# Patient Record
Sex: Female | Born: 2000
Health system: Southern US, Community
[De-identification: ages and names within clinical notes are randomized; demographics above are authoritative.]

## PROBLEM LIST (undated history)

## (undated) DIAGNOSIS — L509 Urticaria, unspecified: Secondary | ICD-10-CM

## (undated) DIAGNOSIS — R1013 Epigastric pain: Secondary | ICD-10-CM

## (undated) DIAGNOSIS — G43909 Migraine, unspecified, not intractable, without status migrainosus: Secondary | ICD-10-CM

## (undated) DIAGNOSIS — K219 Gastro-esophageal reflux disease without esophagitis: Secondary | ICD-10-CM

## (undated) DIAGNOSIS — J189 Pneumonia, unspecified organism: Secondary | ICD-10-CM

## (undated) DIAGNOSIS — Z91018 Allergy to other foods: Secondary | ICD-10-CM

## (undated) DIAGNOSIS — R0982 Postnasal drip: Secondary | ICD-10-CM

## (undated) DIAGNOSIS — J45909 Unspecified asthma, uncomplicated: Secondary | ICD-10-CM

## (undated) HISTORY — DX: Epigastric pain: R10.13

## (undated) HISTORY — PX: TONGUE SURGERY: SHX810

## (undated) HISTORY — DX: Urticaria, unspecified: L50.9

## (undated) HISTORY — DX: Allergy to other foods: Z91.018

---
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
## 2019-01-05 ENCOUNTER — Other Ambulatory Visit: Payer: Self-pay | Admitting: Otolaryngology

## 2019-01-05 ENCOUNTER — Other Ambulatory Visit (HOSPITAL_COMMUNITY): Payer: Self-pay | Admitting: Otolaryngology

## 2019-01-05 DIAGNOSIS — J320 Chronic maxillary sinusitis: Secondary | ICD-10-CM

## 2019-01-10 ENCOUNTER — Other Ambulatory Visit: Payer: Self-pay

## 2019-01-10 ENCOUNTER — Encounter (HOSPITAL_COMMUNITY): Payer: Self-pay | Admitting: Emergency Medicine

## 2019-01-10 ENCOUNTER — Emergency Department (HOSPITAL_COMMUNITY)
Admission: EM | Admit: 2019-01-10 | Discharge: 2019-01-11 | Disposition: A | Discharge status: Home / Self Care | Payer: BLUE CROSS/BLUE SHIELD | Attending: Emergency Medicine | Admitting: Emergency Medicine

## 2019-01-10 DIAGNOSIS — Z79899 Other long term (current) drug therapy: Secondary | ICD-10-CM | POA: Diagnosis not present

## 2019-01-10 DIAGNOSIS — Z91010 Allergy to peanuts: Secondary | ICD-10-CM | POA: Insufficient documentation

## 2019-01-10 DIAGNOSIS — R682 Dry mouth, unspecified: Secondary | ICD-10-CM | POA: Insufficient documentation

## 2019-01-10 DIAGNOSIS — Z91018 Allergy to other foods: Secondary | ICD-10-CM | POA: Insufficient documentation

## 2019-01-10 DIAGNOSIS — J45909 Unspecified asthma, uncomplicated: Secondary | ICD-10-CM | POA: Insufficient documentation

## 2019-01-10 DIAGNOSIS — G43109 Migraine with aura, not intractable, without status migrainosus: Secondary | ICD-10-CM | POA: Insufficient documentation

## 2019-01-10 DIAGNOSIS — R51 Headache: Secondary | ICD-10-CM | POA: Diagnosis present

## 2019-01-10 DIAGNOSIS — E739 Lactose intolerance, unspecified: Secondary | ICD-10-CM | POA: Diagnosis not present

## 2019-01-10 LAB — CBC WITH DIFFERENTIAL/PLATELET
Abs Immature Granulocytes: 0.01 10*3/uL (ref 0.00–0.07)
Basophils Absolute: 0 10*3/uL (ref 0.0–0.1)
Basophils Relative: 0 %
Eosinophils Absolute: 0.4 10*3/uL (ref 0.0–0.5)
Eosinophils Relative: 6 %
HCT: 38.4 % (ref 36.0–46.0)
Hemoglobin: 12.8 g/dL (ref 12.0–15.0)
Immature Granulocytes: 0 %
Lymphocytes Relative: 38 %
Lymphs Abs: 2.4 10*3/uL (ref 0.7–4.0)
MCH: 30.7 pg (ref 26.0–34.0)
MCHC: 33.3 g/dL (ref 30.0–36.0)
MCV: 92.1 fL (ref 80.0–100.0)
Monocytes Absolute: 0.5 10*3/uL (ref 0.1–1.0)
Monocytes Relative: 8 %
Neutro Abs: 3 10*3/uL (ref 1.7–7.7)
Neutrophils Relative %: 48 %
Platelets: 226 10*3/uL (ref 150–400)
RBC: 4.17 MIL/uL (ref 3.87–5.11)
RDW: 12.2 % (ref 11.5–15.5)
WBC: 6.3 10*3/uL (ref 4.0–10.5)
nRBC: 0 % (ref 0.0–0.2)

## 2019-01-10 LAB — BASIC METABOLIC PANEL
Anion gap: 11 (ref 5–15)
BUN: 16 mg/dL (ref 6–20)
CO2: 23 mmol/L (ref 22–32)
Calcium: 9 mg/dL (ref 8.9–10.3)
Chloride: 104 mmol/L (ref 98–111)
Creatinine, Ser: 0.65 mg/dL (ref 0.44–1.00)
GFR calc Af Amer: >60 mL/min (ref >60)
GFR calc non Af Amer: >60 mL/min (ref >60)
Glucose, Bld: 110 mg/dL — ABNORMAL HIGH (ref 70–99)
Potassium: 3.7 mmol/L (ref 3.5–5.1)
Sodium: 138 mmol/L (ref 135–145)

## 2019-01-10 LAB — POC URINE PREG, ED: Preg Test, Ur: NEGATIVE

## 2019-01-10 MED ORDER — BUTALBITAL-APAP-CAFFEINE 50-325-40 MG PO TABS
1.0000 | ORAL_TABLET | Freq: Once | ORAL | Status: AC
  Administered 2019-01-10: 1 via ORAL
  Filled 2019-01-10: qty 1

## 2019-01-10 NOTE — ED Triage Notes (Signed)
Pt c/o headache and stiff neck x one hour. Pt states her mouth is dry.

## 2019-01-10 NOTE — ED Notes (Signed)
Called no answer

## 2019-01-11 NOTE — ED Provider Notes (Signed)
[facility]ERGENCY DEPARTMENT Provider Note   CSN: [id number] Arrival date & time: 01/10/19  1921     History   Chief Complaint Chief Complaint  Patient presents with  . Headache    HPI Pamela Wells is a 18 y.o. female with a history as outlined below including migraine headache diagnosed by her neurologist in [place] in [place], presenting with onset of headache consistent with prior migraines with the exception of new symptom of dry mouth.  She has had MRI imaging as part of her work up. Her headache is left frontal and is associated with numbness without weakness of the left side of her neck radiating into her left arm which is her typical pattern. She denies prodromal sx, no photophobia.  She is prescribed  fioricet for control of acute symptoms but was unable to get her refill from the pharmacy today.   There has been no fevers, chills, syncope, confusion or localized weakness.       The history is provided by the patient.    Past Medical History:  Diagnosis Date  . Asthma    no inhaler use in the past 2 months. 09-08-17  . Food allergy   . GERD (gastroesophageal reflux disease)   . Migraines   . Pneumonia 2018  . Post-nasal drip    pt states "sinus disease"   . Urticaria     Patient Active Problem List   Diagnosis Date Noted  . Moderate persistent asthma without complication 12/22/2017  . Seasonal and perennial allergic rhinitis 12/22/2017  . Dyspepsia   . Abdominal pain, RUQ (right upper quadrant) 09/03/2017  . Dysphagia 09/03/2017  . Multiple food allergies 07/17/2017  . 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 Constipation 07/17/2017    Past Surgical History:  Procedure Laterality Date  . ESOPHAGOGASTRODUODENOSCOPY N/A 09/25/2017   Procedure: ESOPHAGOGASTRODUODENOSCOPY (EGD);  Surgeon: West BaliFields, Sandi L, MD;  Location: [facility];  Service: Endoscopy;  Laterality: N/A;  9:30am  . TONGUE SURGERY     HAD FRENULUM CLIPPED     OB History    Gravida      Para      Term      Preterm      AB      Living  0     SAB      TAB      Ectopic      Multiple      Live Births           Obstetric Comments  Menses monthly. Occur each month. Menarche at age 18.          Home Medications    Prior to Admission medications   Medication Sig Start Date End Date Taking? Authorizing Provider  albuterol (PROVENTIL HFA;VENTOLIN HFA) 108 (90 Base) MCG/ACT inhaler Inhale 1-2 puffs into the lungs every 6 (six) hours as needed for wheezing or shortness of breath. 12/22/17  Yes Alfonse SpruceGallagher, Joel Louis, MD  albuterol (PROVENTIL) (2.5 MG/3ML) 0.083% nebulizer solution Take 3 mLs (2.5 mg total) by nebulization every 4 (four) hours as needed for wheezing or shortness of breath. 10/20/18  Yes Alfonse SpruceGallagher, Joel Louis, MD  butalbital-acetaminophen-caffeine (FIORICET) [phone] MG tablet Take 1 tablet by mouth every 6 (six) hours as needed for headache.  09/22/18  Yes [provider]  fluticasone furoate-vilanterol (BREO ELLIPTA) 100-25 MCG/INH AEPB Inhale 1 puff into the lungs daily. 10/21/18  Yes Alfonse SpruceGallagher, Joel Louis, MD  imipramine (TOFRANIL) 10 MG tablet Take 10 mg by  mouth at bedtime.  08/18/18  Yes [provider]  montelukast (SINGULAIR) 10 MG tablet Take 1 tablet (10 mg total) by mouth at bedtime. 10/21/18  Yes Valentina Shaggy, MD  azithromycin (ZITHROMAX) 250 MG tablet TAKE 2 TABLETS BY MOUTH ON DAY 1 AND THEN TAKE 1 TABLET BY MOUTH ONCE A DAY ON DAY 2 THROUGH DAY 5 11/22/18   [provider]  cetirizine (ZYRTEC) 10 MG tablet Take 1 tablet (10 mg total) by mouth daily for 30 days. 10/21/18 11/20/18  Valentina Shaggy, MD  Fluticasone Propionate Truett Perna) 93 MCG/ACT EXHU Place 2 sprays into the nose 2 (two) times a day. Patient not taking: Reported on 01/10/2019 11/24/18   Valentina Shaggy, MD  predniSONE (DELTASONE) 10 MG tablet Take two tablets (20mg ) twice daily for three days, then one tablet (10mg ) twice  daily for three days, then STOP. Patient not taking: Reported on 01/10/2019 12/01/18   Valentina Shaggy, MD  SUMAtriptan (IMITREX) 25 MG tablet Take 25 mg by mouth every 2 (two) hours as needed for headache.  08/17/18   [provider]    Family History Family History  Problem Relation Age of Onset  . Hypertension Father   . Ovarian cancer Maternal Grandmother   . Colon cancer Neg Hx   . Colon polyps Neg Hx     Social History Social History   Tobacco Use  . Smoking status: Never Smoker  . Smokeless tobacco: Never Used  Substance Use Topics  . Alcohol use: Not Currently    Comment: occ  . Drug use: No     Allergies   Cheese, Lactose intolerance (gi), Peanut-containing drug products, and Sesame oil   Review of Systems Review of Systems  Constitutional: Negative for fever.  HENT: Negative for congestion and sore throat.   Eyes: Negative.   Respiratory: Negative for chest tightness and shortness of breath.   Cardiovascular: Negative for chest pain.  Gastrointestinal: Negative for abdominal pain and nausea.  Genitourinary: Negative.   Musculoskeletal: Negative for arthralgias, joint swelling and neck pain.  Skin: Negative.  Negative for rash and wound.  Neurological: Positive for numbness and headaches. Negative for dizziness, weakness and light-headedness.  Psychiatric/Behavioral: Negative.      Physical Exam Updated Vital Signs BP 115/84 (BP Location: Right Arm)   Pulse 72   Temp 98 F (36.7 C) (Oral)   Resp 17   Ht 5' 3.5" (1.613 m)   Wt 71.2 kg   LMP 12/13/2018   SpO2 100%   BMI 27.38 kg/m   Physical Exam Vitals signs and nursing note reviewed.  Constitutional:      Appearance: She is well-developed.     Comments: Uncomfortable appearing  HENT:     Head: Normocephalic and atraumatic.  Eyes:     Pupils: Pupils are equal, round, and reactive to light.  Neck:     Musculoskeletal: Normal range of motion and neck supple.  Cardiovascular:      Rate and Rhythm: Normal rate.     Heart sounds: Normal heart sounds.  Pulmonary:     Effort: Pulmonary effort is normal.  Abdominal:     Palpations: Abdomen is soft.     Tenderness: There is no abdominal tenderness.  Musculoskeletal: Normal range of motion.  Lymphadenopathy:     Cervical: No cervical adenopathy.  Skin:    General: Skin is warm and dry.     Findings: No rash.  Neurological:     Mental Status: She is  alert and oriented to person, place, and time.     GCS: GCS eye subscore is 4. GCS verbal subscore is 5. GCS motor subscore is 6.     Sensory: No sensory deficit.     Gait: Gait normal.     Comments: Normal heel-shin, normal finger nose. Cranial nerves III-XII intact.  No pronator drift. Equal grips.  Decreased sensation to light touch, sharp intact left arm, stocking glove distribution.  Psychiatric:        Speech: Speech normal.        Behavior: Behavior normal.        Thought Content: Thought content normal.      ED Treatments / Results  Labs (all labs ordered are listed, but only abnormal results are displayed) Labs Reviewed  BASIC METABOLIC PANEL - Abnormal; Notable for the following components:      Result Value   Glucose, Bld 110 (*)    All other components within normal limits  CBC WITH DIFFERENTIAL/PLATELET  POC URINE PREG, ED    EKG None  Radiology No results found.  Procedures Procedures (including critical care time)  Medications Ordered in ED Medications  butalbital-acetaminophen-caffeine (FIORICET) 50-325-40 MG per tablet 1 tablet (1 tablet Oral Given 01/10/19 2317)     Initial Impression / Assessment and Plan / ED Course  I have reviewed the triage vital signs and the nursing notes.  Pertinent labs & imaging results that were available during my care of the patient were reviewed by me and considered in my medical decision making (see chart for details).        Pt given fioricet here with resolution of headache, improved numbness,  still with dry mouth.  Labs reviewed, no acute findings.  Advised increased fluids, get fioricet filled for sx relief prn. Prn f/u anticipated.  Final Clinical Impressions(s) / ED Diagnoses   Final diagnoses:  Migraine with aura and without status migrainosus, not intractable    ED Discharge Orders    None       Victoriano Laindol, Jeslynn Hollander, PA-C 01/12/19 1238    Loren RacerYelverton, David, MD 01/13/19 1505

## 2019-01-11 NOTE — ED Notes (Signed)
Discharge signature was signed on incorrect pt

## 2019-01-11 NOTE — Discharge Instructions (Signed)
Plan to get your refill medications for your migraine headaches filled tomorrow as discussed and take per your neurologists instructions.  Return here for any return or worsening of your symptoms.

## 2019-01-14 ENCOUNTER — Ambulatory Visit (HOSPITAL_COMMUNITY)
Admission: RE | Admit: 2019-01-14 | Discharge: 2019-01-14 | Disposition: A | Discharge status: Home / Self Care | Payer: BLUE CROSS/BLUE SHIELD | Source: Ambulatory Visit | Attending: Otolaryngology | Admitting: Otolaryngology

## 2019-01-14 ENCOUNTER — Other Ambulatory Visit: Payer: Self-pay

## 2019-01-14 DIAGNOSIS — J320 Chronic maxillary sinusitis: Secondary | ICD-10-CM

## 2019-01-27 ENCOUNTER — Ambulatory Visit (INDEPENDENT_AMBULATORY_CARE_PROVIDER_SITE_OTHER): Payer: BLUE CROSS/BLUE SHIELD | Admitting: Otolaryngology

## 2019-02-03 ENCOUNTER — Other Ambulatory Visit: Payer: Self-pay

## 2019-02-03 ENCOUNTER — Encounter: Payer: Self-pay | Admitting: Neurology

## 2019-02-03 ENCOUNTER — Ambulatory Visit (INDEPENDENT_AMBULATORY_CARE_PROVIDER_SITE_OTHER): Payer: BLUE CROSS/BLUE SHIELD | Admitting: Neurology

## 2019-02-03 VITALS — BP 106/61 | HR 72 | Temp 97.8°F | Ht 63.5 in | Wt 165.4 lb

## 2019-02-03 DIAGNOSIS — R7309 Other abnormal glucose: Secondary | ICD-10-CM

## 2019-02-03 DIAGNOSIS — G43109 Migraine with aura, not intractable, without status migrainosus: Secondary | ICD-10-CM

## 2019-02-03 DIAGNOSIS — G43719 Chronic migraine without aura, intractable, without status migrainosus: Secondary | ICD-10-CM

## 2019-02-03 DIAGNOSIS — R682 Dry mouth, unspecified: Secondary | ICD-10-CM

## 2019-02-03 HISTORY — DX: Chronic migraine without aura, intractable, without status migrainosus: G43.719

## 2019-02-03 HISTORY — DX: Migraine with aura, not intractable, without status migrainosus: G43.109

## 2019-02-03 MED ORDER — ONDANSETRON 4 MG PO TBDP: 4.0000 mg | ORAL_TABLET | Freq: Three times a day (TID) | ORAL | 3 refills | Status: DC | PRN

## 2019-02-03 MED ORDER — TOPIRAMATE 100 MG PO TABS: 100.0000 mg | ORAL_TABLET | Freq: Two times a day (BID) | ORAL | 6 refills | Status: DC

## 2019-02-03 MED ORDER — RIZATRIPTAN BENZOATE 10 MG PO TBDP: 10.0000 mg | ORAL_TABLET | ORAL | 11 refills | Status: DC | PRN

## 2019-02-03 MED ORDER — AMITRIPTYLINE HCL 25 MG PO TABS: 25.0000 mg | ORAL_TABLET | Freq: Every day | ORAL | 6 refills | Status: DC

## 2019-02-03 NOTE — Progress Notes (Signed)
[facility]    Provider:  Dr Lucia GaskinsAhern Requesting Provider: No ref. provider found Primary Care Provider:  Default, Provider, MD  CC:  migraines  HPI:  Pamela Wells is a 18 y.o. female here as requested by the emergency room for migraines. She has a PMHx of migraines and had a workup including MRI of the brain without etiology. She tried Melatonin 1000mg , she is not sleeping. She tried Fioricet, Topamax, she stopped the Topamax because she had to be on antibiotics and felt the combo made her feel bad, she restarted Topamax and still on it, likely 25mg  once daily (in prior notes was given Topamax 25mg  twice daily with increase to 50mg  bid but she never did that and only on 25mg  once daily now. She went to the ED recently due to severe migraines, she does get left arm and leg numbness and tingling but her whole left side was painful, she couldn't move her neck. She did NOT take her medications when the migraine started and then went to ED. Imitrex did not work. SHe has migraines with nausea, migraine on the left side, throbbing in the back and in the neck, light and sound sensitivity, ongoing for a year. No family history. She can have dry heaves and nausea. They can be moderately severe to severe. She does not have a pcp, she needs one asked her. She has an aura, dots, precedes the migraine and also get sensory changes in the left side, she has dizziness. No OTC medication.   Reviewed notes, labs and imaging from outside physicians, which showed:  tsh normal  Cb/cmp with elevated gkucose  Reviewed report 08/2018 MRI brain w/wo cont: MRI brain normal. Sinus disease.   Review of Systems: Patient complains of symptoms per HPI as well as the following symptoms: sinus disease. Pertinent negatives and positives per HPI. All others negative.   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[employer], [place]  Social Needs   Financial resource strain: Not on file   Food insecurity    Worry: Not on file    Inability: Not on file   Transportation needs    Medical: Not on file    Non-medical: Not on file  Tobacco Use   Smoking status: Never Smoker   Smokeless tobacco: Never Used  Substance and Sexual Activity   Alcohol use: Not Currently    Comment: occ   Drug use: No   Sexual activity: Never  Lifestyle   Physical activity    Days per week: Not on file    Minutes per session: Not on file   Stress: Not on file  Relationships   Social connections    Talks on phone: Not on file    Gets together: Not on file    Attends religious service: Not on file    Active member of club or organization: Not on file    Attends meetings of clubs or organizations: Not on file    Relationship status: Not on file   Intimate partner violence    Fear of current or ex partner: Not on file    Emotionally abused: Not on file    Physically abused: Not on file    Forced sexual activity: Not on file  Other Topics Concern   Not on file  Social History Narrative   [place], [place]    Eats all food groups.   Wears seatbelt.    Drives a car.    Does not smoke.    Reports that is religious and believes in God.   Reports she is not sexually active.  Denies any tobacco, alcohol, or drug use.    Family History  Problem Relation Age of Onset   Hypertension Father    Diabetes Father    Ovarian cancer Maternal Grandmother    Stroke Maternal Grandfather    Colon cancer Neg Hx    Colon polyps Neg Hx     Past Medical History:  Diagnosis Date   Asthma    no inhaler use in the past 2 months. 09-08-17   Food allergy    GERD (gastroesophageal reflux disease)    Migraines    migraines   Pneumonia 2018   Post-nasal drip    pt states "sinus disease"      Urticaria     Patient Active Problem List   Diagnosis Date Noted   Moderate persistent asthma without complication 12/22/2017   Seasonal and perennial allergic rhinitis 12/22/2017   Dyspepsia    Abdominal pain, RUQ (right upper quadrant) 09/03/2017   Dysphagia 09/03/2017   Multiple food allergies 07/17/2017   Skin pigmentation disorder 07/17/2017   Constipation 07/17/2017    Past Surgical History:  Procedure Laterality Date   ESOPHAGOGASTRODUODENOSCOPY N/A 09/25/2017   Procedure: ESOPHAGOGASTRODUODENOSCOPY (EGD);  Surgeon: West BaliFields, Sandi L, MD;  Location: [facility];  Service: Endoscopy;  Laterality: N/A;  9:30am   TONGUE SURGERY     HAD FRENULUM CLIPPED    Current Outpatient Medications  Medication Sig Dispense Refill   albuterol (PROVENTIL HFA;VENTOLIN HFA) 108 (90 Base) MCG/ACT inhaler Inhale 1-2 puffs into the lungs every 6 (six) hours as needed for wheezing or shortness of breath. 1 Inhaler 1   albuterol (PROVENTIL) (2.5 MG/3ML) 0.083% nebulizer solution Take 3 mLs (2.5 mg total) by nebulization every 4 (four) hours as needed for wheezing or shortness of breath. 75 mL 1   cetirizine (ZYRTEC) 10 MG tablet Take 1 tablet (10 mg total) by mouth daily for 30 days. 30 tablet 5   fluticasone furoate-vilanterol (BREO ELLIPTA) 100-25 MCG/INH AEPB Inhale 1 puff into the lungs daily. 30 each 5   Fluticasone Propionate (XHANCE) 93 MCG/ACT EXHU Place 2 sprays into the nose 2 (two) times a day. 16 mL 5   montelukast (SINGULAIR) 10 MG tablet Take 1 tablet (10 mg total) by mouth at bedtime. 30 tablet 5   amitriptyline (ELAVIL) 25 MG tablet Take 1-2 tablets (25-50 mg total) by mouth at bedtime. 60 tablet 6   ondansetron (ZOFRAN-ODT) 4 MG disintegrating tablet Take 1 tablet (4 mg total) by mouth every 8 (eight) hours as needed for nausea. 30 tablet 3   rizatriptan (MAXALT-MLT) 10 MG disintegrating tablet Take 1 tablet (10 mg total) by mouth as needed for migraine. May  repeat in 2 hours if needed 9 tablet 11   topiramate (TOPAMAX) 100 MG tablet Take 1 tablet (100 mg total) by mouth 2 (two) times daily. 90 tablet 6   No current facility-administered medications for this visit.     Allergies as of 02/03/2019 - Review Complete 02/03/2019  Allergen Reaction Noted   Cheese Hives 12/31/2012   Lactose intolerance (gi)     Peanut-containing drug products Hives 12/31/2012   Sesame oil  08/17/2018  Vitals: BP 106/61    Pulse 72    Temp 97.8 F (36.6 C)    Ht 5' 3.5" (1.613 m)    Wt 165 lb 6.4 oz (75 kg)    BMI 28.84 kg/m  Last Weight:  Wt Readings from Last 1 Encounters:  02/03/19 165 lb 6.4 oz (75 kg) (90 %, Z= 1.29)*   * Growth percentiles are based on CDC (Girls, 2-20 Years) data.   Last Height:   Ht Readings from Last 1 Encounters:  02/03/19 5' 3.5" (1.613 m) (38 %, Z= -0.31)*   * Growth percentiles are based on CDC (Girls, 2-20 Years) data.     Physical exam: Exam: Gen: NAD, conversant, well nourised, well groomed                     CV: RRR, no MRG. No Carotid Bruits. No peripheral edema, warm, nontender Eyes: Conjunctivae clear without exudates or hemorrhage  Neuro: Detailed Neurologic Exam  Speech:    Speech is normal; fluent and spontaneous with normal comprehension.  Cognition:    The patient is oriented to person, place, and time;     recent and remote memory intact;     language fluent;     normal attention, concentration,     fund of knowledge Cranial Nerves:    The pupils are equal, round, and reactive to light. The fundi are normal and spontaneous venous pulsations are present. Visual fields are full to finger confrontation. Extraocular movements are intact. Trigeminal sensation is intact and the muscles of mastication are normal. The face is symmetric. The palate elevates in the midline. Hearing intact. Voice is normal. Shoulder shrug is normal. The tongue has normal motion without fasciculations.   Coordination:     Normal finger to nose and heel to shin. Normal rapid alternating movements.   Gait:    Heel-toe and tandem gait are normal.   Motor Observation:    No asymmetry, no atrophy, and no involuntary movements noted. Tone:    Normal muscle tone.    Posture:    Posture is normal. normal erect    Strength:    Strength is V/V in the upper and lower limbs.      Sensation: intact to LT     Reflex Exam:  DTR's:    Deep tendon reflexes in the upper and lower extremities are normal bilaterally.   Toes:    The toes are downgoing bilaterally.   Clonus:    Clonus is absent.    Assessment/Plan:  Chronic migraines with and without aura intractable without status migrainosus  - She does not have a pcp, she needs one asked her - Sinus disease seen on last MRI, needs follow up can be contributing ti headaches - Discussed contraindication for triptans in basilar type migraie but there is plenty of evidence to show they are safe, she understands the risks and can try maxalt. - Sinus disease, following with ENT - Preventative Topamax (discussed teratogenicity with Topiramate, do not get pregnant) - Acute Maxalt and Zofran  There is increased risk for stroke in women with migraine with aura and a contraindication for the combined contraceptive pill for use by women who have migraine with aura. The risk for women with migraine without aura is lower. However other risk factors like smoking are far more likely to increase stroke risk than migraine. There is a recommendation for no smoking and for the use of OCPs without estrogen such as progestogen only pills  particularly for women with migraine with aura.Marland Kitchen People who have migraine headaches with auras may be 3 times more likely to have a stroke caused by a blood clot, compared to migraine patients who don't see auras. Women who take hormone-replacement therapy may be 30 percent more likely to suffer a clot-based stroke than women not taking medication  containing estrogen. Other risk factors like smoking and high blood pressure may be  much more important.    Orders Placed This Encounter  Procedures   Hemoglobin A1c   Meds ordered this encounter  Medications   topiramate (TOPAMAX) 100 MG tablet    Sig: Take 1 tablet (100 mg total) by mouth 2 (two) times daily.    Dispense:  90 tablet    Refill:  6   amitriptyline (ELAVIL) 25 MG tablet    Sig: Take 1-2 tablets (25-50 mg total) by mouth at bedtime.    Dispense:  60 tablet    Refill:  6   rizatriptan (MAXALT-MLT) 10 MG disintegrating tablet    Sig: Take 1 tablet (10 mg total) by mouth as needed for migraine. May repeat in 2 hours if needed    Dispense:  9 tablet    Refill:  11   ondansetron (ZOFRAN-ODT) 4 MG disintegrating tablet    Sig: Take 1 tablet (4 mg total) by mouth every 8 (eight) hours as needed for nausea.    Dispense:  30 tablet    Refill:  3   Discussed: To prevent or relieve headaches, try the following: Cool Compress. Lie down and place a cool compress on your head.  Avoid headache triggers. If certain foods or odors seem to have triggered your migraines in the past, avoid them. A headache diary might help you identify triggers.  Include physical activity in your daily routine. Try a daily walk or other moderate aerobic exercise.  Manage stress. Find healthy ways to cope with the stressors, such as delegating tasks on your to-do list.  Practice relaxation techniques. Try deep breathing, yoga, massage and visualization.  Eat regularly. Eating regularly scheduled meals and maintaining a healthy diet might help prevent headaches. Also, drink plenty of fluids.  Follow a regular sleep schedule. Sleep deprivation might contribute to headaches Consider biofeedback. With this mind-body technique, you learn to control certain bodily functions -- such as muscle tension, heart rate and blood pressure -- to prevent headaches or reduce headache pain.    Proceed to emergency  room if you experience new or worsening symptoms or symptoms do not resolve, if you have new neurologic symptoms or if headache is severe, or for any concerning symptom.   Provided education and documentation from American headache Society toolbox including articles on: chronic migraine medication overuse headache, chronic migraines, prevention of migraines, behavioral and other nonpharmacologic treatments for headache.  A total of 60 minutes was spent face-to-face with this patient. Over half this time was spent on counseling patient on the  1. Intractable chronic migraine without aura and without status migrainosus   2. Basilar migraine   3. Migraine with aura and without status migrainosus, not intractable   4. Elevated glucose level   5. Dry mouth    diagnosis and different diagnostic and therapeutic options, counseling and coordination of care, risks ans benefits of management, compliance, or risk factor reduction and education.     Sarina Ill, MD  [facility] [address] [place], [place] [address]  Phone [phone] Fax [phone]

## 2019-02-03 NOTE — Patient Instructions (Addendum)
Increase Topamax to 100mg  at bedtime Start Amitriptyline at bedtime Stop Fioricet - risk of rebound headache At onset of headache: Rizatriptan(maxalt) and ondansetron(zofran) for nausea can actually take them together. Please take one tablet at the onset of your headache. If it does not improve the symptoms please take one additional tablet. Do not take more then 2 tablets in 24hrs. Do not take use more then 2 to 3 times in a week. DO not get pregnat   There is increased risk for stroke in women with migraine with aura and a contraindication for the combined contraceptive pill for use by women who have migraine with aura. The risk for women with migraine without aura is lower. However other risk factors like smoking are far more likely to increase stroke risk than migraine. There is a recommendation for no smoking and for the use of OCPs without estrogen such as progestogen only pills particularly for women with migraine with aura.Marland Kitchen. People who have migraine headaches with auras may be 3 times more likely to have a stroke caused by a blood clot, compared to migraine patients who don't see auras. Women who take hormone-replacement therapy may be 30 percent more likely to suffer a clot-based stroke than women not taking medication containing estrogen. Other risk factors like smoking and high blood pressure may be  much more important.  Ondansetron tablets What is this medicine? ONDANSETRON (on DAN se tron) is used to treat nausea and vomiting caused by chemotherapy. It is also used to prevent or treat nausea and vomiting after surgery. This medicine may be used for other purposes; ask your health care provider or pharmacist if you have questions. COMMON BRAND NAME(S): Zofran What should I tell my health care provider before I take this medicine? They need to know if you have any of these conditions:  heart disease  history of irregular heartbeat  liver disease  low levels of magnesium or  potassium in the blood  an unusual or allergic reaction to ondansetron, granisetron, other medicines, foods, dyes, or preservatives  pregnant or trying to get pregnant  breast-feeding How should I use this medicine? Take this medicine by mouth with a glass of water. Follow the directions on your prescription label. Take your doses at regular intervals. Do not take your medicine more often than directed. Talk to your pediatrician regarding the use of this medicine in children. Special care may be needed. Overdosage: If you think you have taken too much of this medicine contact a poison control center or emergency room at once. NOTE: This medicine is only for you. Do not share this medicine with others. What if I miss a dose? If you miss a dose, take it as soon as you can. If it is almost time for your next dose, take only that dose. Do not take double or extra doses. What may interact with this medicine? Do not take this medicine with any of the following medications:  apomorphine  certain medicines for fungal infections like fluconazole, itraconazole, ketoconazole, posaconazole, voriconazole  cisapride  dronedarone  pimozide  thioridazine This medicine may also interact with the following medications:  carbamazepine  certain medicines for depression, anxiety, or psychotic disturbances  fentanyl  linezolid  MAOIs like Carbex, Eldepryl, Marplan, Nardil, and Parnate  methylene blue (injected into a vein)  other medicines that prolong the QT interval (cause an abnormal heart rhythm) like dofetilide, ziprasidone  phenytoin  rifampicin  tramadol This list may not describe all possible interactions. Give your health  care provider a list of all the medicines, herbs, non-prescription drugs, or dietary supplements you use. Also tell them if you smoke, drink alcohol, or use illegal drugs. Some items may interact with your medicine. What should I watch for while using this  medicine? Check with your doctor or health care professional right away if you have any sign of an allergic reaction. What side effects may I notice from receiving this medicine? Side effects that you should report to your doctor or health care professional as soon as possible:  allergic reactions like skin rash, itching or hives, swelling of the face, lips or tongue  breathing problems  confusion  dizziness  fast or irregular heartbeat  feeling faint or lightheaded, falls  fever and chills  loss of balance or coordination  seizures  sweating  swelling of the hands or feet  tightness in the chest  tremors  unusually weak or tired Side effects that usually do not require medical attention (report to your doctor or health care professional if they continue or are bothersome):  constipation or diarrhea  headache This list may not describe all possible side effects. Call your doctor for medical advice about side effects. You may report side effects to FDA at 1-800-FDA-1088. Where should I keep my medicine? Keep out of the reach of children. Store between 2 and 30 degrees C (36 and 86 degrees F). Throw away any unused medicine after the expiration date. NOTE: This sheet is a summary. It may not cover all possible information. If you have questions about this medicine, talk to your doctor, pharmacist, or health care provider.  2020 Elsevier/Gold Standard (2018-06-15 07:16:43)  Rizatriptan tablets What is this medicine? RIZATRIPTAN (rye za TRIP tan) is used to treat migraines with or without aura. An aura is a strange feeling or visual disturbance that warns you of an attack. It is not used to prevent migraines. This medicine may be used for other purposes; ask your health care provider or pharmacist if you have questions. COMMON BRAND NAME(S): Maxalt What should I tell my health care provider before I take this medicine? They need to know if you have any of these  conditions:  cigarette smoker  circulation problems in fingers and toes  diabetes  heart disease  high blood pressure  high cholesterol  history of irregular heartbeat  history of stroke  kidney disease  liver disease  stomach or intestine problems  an unusual or allergic reaction to rizatriptan, other medicines, foods, dyes, or preservatives  pregnant or trying to get pregnant  breast-feeding How should I use this medicine? Take this medicine by mouth with a glass of water. Follow the directions on the prescription label. Do not take it more often than directed. Talk to your pediatrician regarding the use of this medicine in children. While this drug may be prescribed for children as young as 6 years for selected conditions, precautions do apply. Overdosage: If you think you have taken too much of this medicine contact a poison control center or emergency room at once. NOTE: This medicine is only for you. Do not share this medicine with others. What if I miss a dose? This does not apply. This medicine is not for regular use. What may interact with this medicine? Do not take this medicine with any of the following medicines:  certain medicines for migraine headache like almotriptan, eletriptan, frovatriptan, naratriptan, rizatriptan, sumatriptan, zolmitriptan  ergot alkaloids like dihydroergotamine, ergonovine, ergotamine, methylergonovine  MAOIs like Carbex, Eldepryl, Marplan, Nardil,  and Parnate This medicine may also interact with the following medications:  certain medicines for depression, anxiety, or psychotic disorders  propranolol This list may not describe all possible interactions. Give your health care provider a list of all the medicines, herbs, non-prescription drugs, or dietary supplements you use. Also tell them if you smoke, drink alcohol, or use illegal drugs. Some items may interact with your medicine. What should I watch for while using this  medicine? Visit your healthcare professional for regular checks on your progress. Tell your healthcare professional if your symptoms do not start to get better or if they get worse. You may get drowsy or dizzy. Do not drive, use machinery, or do anything that needs mental alertness until you know how this medicine affects you. Do not stand up or sit up quickly, especially if you are an older patient. This reduces the risk of dizzy or fainting spells. Alcohol may interfere with the effect of this medicine. Your mouth may get dry. Chewing sugarless gum or sucking hard candy and drinking plenty of water may help. Contact your healthcare professional if the problem does not go away or is severe. If you take migraine medicines for 10 or more days a month, your migraines may get worse. Keep a diary of headache days and medicine use. Contact your healthcare professional if your migraine attacks occur more frequently. What side effects may I notice from receiving this medicine? Side effects that you should report to your doctor or health care professional as soon as possible:  allergic reactions like skin rash, itching or hives, swelling of the face, lips, or tongue  chest pain or chest tightness  signs and symptoms of a dangerous change in heartbeat or heart rhythm like chest pain; dizziness; fast, irregular heartbeat; palpitations; feeling faint or lightheaded; falls; breathing problems  signs and symptoms of a stroke like changes in vision; confusion; trouble speaking or understanding; severe headaches; sudden numbness or weakness of the face, arm or leg; trouble walking; dizziness; loss of balance or coordination  signs and symptoms of serotonin syndrome like irritable; confusion; diarrhea; fast or irregular heartbeat; muscle twitching; stiff muscles; trouble walking; sweating; high fever; seizures; chills; vomiting Side effects that usually do not require medical attention (report to your doctor or  health care professional if they continue or are bothersome):  diarrhea  dizziness  drowsiness  dry mouth  headache  nausea, vomiting  pain, tingling, numbness in the hands or feet  stomach pain This list may not describe all possible side effects. Call your doctor for medical advice about side effects. You may report side effects to FDA at 1-800-FDA-1088. Where should I keep my medicine? Keep out of the reach of children. Store at room temperature between 15 and 30 degrees C (59 and 86 degrees F). Keep container tightly closed. Throw away any unused medicine after the expiration date. NOTE: This sheet is a summary. It may not cover all possible information. If you have questions about this medicine, talk to your doctor, pharmacist, or health care provider.  2020 Elsevier/Gold Standard (2018-01-05 14:59:59)    Topiramate tablets What is this medicine? TOPIRAMATE (toe PYRE a mate) is used to treat seizures in adults or children with epilepsy. It is also used for the prevention of migraine headaches. This medicine may be used for other purposes; ask your health care provider or pharmacist if you have questions. COMMON BRAND NAME(S): Topamax, Topiragen What should I tell my health care provider before I take this medicine?  They need to know if you have any of these conditions:  bleeding disorders  cirrhosis of the liver or liver disease  diarrhea  glaucoma  kidney stones or kidney disease  low blood counts, like low white cell, platelet, or red cell counts  lung disease like asthma, obstructive pulmonary disease, emphysema  metabolic acidosis  on a ketogenic diet  schedule for surgery or a procedure  suicidal thoughts, plans, or attempt; a previous suicide attempt by you or a family member  an unusual or allergic reaction to topiramate, other medicines, foods, dyes, or preservatives  pregnant or trying to get pregnant  breast-feeding How should I use this  medicine? Take this medicine by mouth with a glass of water. Follow the directions on the prescription label. Do not crush or chew. You may take this medicine with meals. Take your medicine at regular intervals. Do not take it more often than directed. Talk to your pediatrician regarding the use of this medicine in children. Special care may be needed. While this drug may be prescribed for children as young as 292 years of age for selected conditions, precautions do apply. Overdosage: If you think you have taken too much of this medicine contact a poison control center or emergency room at once. NOTE: This medicine is only for you. Do not share this medicine with others. What if I miss a dose? If you miss a dose, take it as soon as you can. If your next dose is to be taken in less than 6 hours, then do not take the missed dose. Take the next dose at your regular time. Do not take double or extra doses. What may interact with this medicine? Do not take this medicine with any of the following medications:  probenecid This medicine may also interact with the following medications:  acetazolamide  alcohol  amitriptyline  aspirin and aspirin-like medicines  birth control pills  certain medicines for depression  certain medicines for seizures  certain medicines that treat or prevent blood clots like warfarin, enoxaparin, dalteparin, apixaban, dabigatran, and rivaroxaban  digoxin  hydrochlorothiazide  lithium  medicines for pain, sleep, or muscle relaxation  metformin  methazolamide  NSAIDS, medicines for pain and inflammation, like ibuprofen or naproxen  pioglitazone  risperidone This list may not describe all possible interactions. Give your health care provider a list of all the medicines, herbs, non-prescription drugs, or dietary supplements you use. Also tell them if you smoke, drink alcohol, or use illegal drugs. Some items may interact with your medicine. What should I  watch for while using this medicine? Visit your doctor or health care professional for regular checks on your progress. Do not stop taking this medicine suddenly. This increases the risk of seizures if you are using this medicine to control epilepsy. Wear a medical identification bracelet or chain to say you have epilepsy or seizures, and carry a card that lists all your medicines. This medicine can decrease sweating and increase your body temperature. Watch for signs of deceased sweating or fever, especially in children. Avoid extreme heat, hot baths, and saunas. Be careful about exercising, especially in hot weather. Contact your health care provider right away if you notice a fever or decrease in sweating. You should drink plenty of fluids while taking this medicine. If you have had kidney stones in the past, this will help to reduce your chances of forming kidney stones. If you have stomach pain, with nausea or vomiting and yellowing of your eyes or skin,  call your doctor immediately. You may get drowsy, dizzy, or have blurred vision. Do not drive, use machinery, or do anything that needs mental alertness until you know how this medicine affects you. To reduce dizziness, do not sit or stand up quickly, especially if you are an older patient. Alcohol can increase drowsiness and dizziness. Avoid alcoholic drinks. If you notice blurred vision, eye pain, or other eye problems, seek medical attention at once for an eye exam. The use of this medicine may increase the chance of suicidal thoughts or actions. Pay special attention to how you are responding while on this medicine. Any worsening of mood, or thoughts of suicide or dying should be reported to your health care professional right away. This medicine may increase the chance of developing metabolic acidosis. If left untreated, this can cause kidney stones, bone disease, or slowed growth in children. Symptoms include breathing fast, fatigue, loss of  appetite, irregular heartbeat, or loss of consciousness. Call your doctor immediately if you experience any of these side effects. Also, tell your doctor about any surgery you plan on having while taking this medicine since this may increase your risk for metabolic acidosis. Birth control pills may not work properly while you are taking this medicine. Talk to your doctor about using an extra method of birth control. Women who become pregnant while using this medicine may enroll in the [place] Pregnancy Registry by calling [phone]. This registry collects information about the safety of antiepileptic drug use during pregnancy. What side effects may I notice from receiving this medicine? Side effects that you should report to your doctor or health care professional as soon as possible:  allergic reactions like skin rash, itching or hives, swelling of the face, lips, or tongue  decreased sweating and/or rise in body temperature  depression  difficulty breathing, fast or irregular breathing patterns  difficulty speaking  difficulty walking or controlling muscle movements  hearing impairment  redness, blistering, peeling or loosening of the skin, including inside the mouth  tingling, pain or numbness in the hands or feet  unusual bleeding or bruising  unusually weak or tired  worsening of mood, thoughts or actions of suicide or dying Side effects that usually do not require medical attention (report to your doctor or health care professional if they continue or are bothersome):  altered taste  back pain, joint or muscle aches and pains  diarrhea, or constipation  headache  loss of appetite  nausea  stomach upset, indigestion  tremors This list may not describe all possible side effects. Call your doctor for medical advice about side effects. You may report side effects to FDA at 1-800-FDA-1088. Where should I keep my medicine? Keep out of the  reach of children. Store at room temperature between 15 and 30 degrees C (59 and 86 degrees F) in a tightly closed container. Protect from moisture. Throw away any unused medicine after the expiration date. NOTE: This sheet is a summary. It may not cover all possible information. If you have questions about this medicine, talk to your doctor, pharmacist, or health care provider.  2020 Elsevier/Gold Standard (2013-06-27 23:17:57)

## 2019-02-04 LAB — HEMOGLOBIN A1C
Est. average glucose Bld gHb Est-mCnc: 105 mg/dL
Hgb A1c MFr Bld: 5.3 % (ref 4.8–5.6)

## 2019-02-07 ENCOUNTER — Emergency Department (HOSPITAL_COMMUNITY)
Admission: EM | Admit: 2019-02-07 | Discharge: 2019-02-07 | Disposition: A | Discharge status: Home / Self Care | Payer: BLUE CROSS/BLUE SHIELD | Attending: Emergency Medicine | Admitting: Emergency Medicine

## 2019-02-07 ENCOUNTER — Other Ambulatory Visit: Payer: Self-pay

## 2019-02-07 ENCOUNTER — Encounter (HOSPITAL_COMMUNITY): Payer: Self-pay | Admitting: Emergency Medicine

## 2019-02-07 DIAGNOSIS — J069 Acute upper respiratory infection, unspecified: Secondary | ICD-10-CM | POA: Diagnosis not present

## 2019-02-07 DIAGNOSIS — J039 Acute tonsillitis, unspecified: Secondary | ICD-10-CM

## 2019-02-07 DIAGNOSIS — J45909 Unspecified asthma, uncomplicated: Secondary | ICD-10-CM | POA: Diagnosis not present

## 2019-02-07 DIAGNOSIS — Z79899 Other long term (current) drug therapy: Secondary | ICD-10-CM | POA: Diagnosis not present

## 2019-02-07 DIAGNOSIS — R070 Pain in throat: Secondary | ICD-10-CM | POA: Diagnosis present

## 2019-02-07 DIAGNOSIS — Z91010 Allergy to peanuts: Secondary | ICD-10-CM | POA: Diagnosis not present

## 2019-02-07 LAB — GROUP A STREP BY PCR: Group A Strep by PCR: NOT DETECTED

## 2019-02-07 MED ORDER — AMOXICILLIN 250 MG PO CAPS
500.0000 mg | ORAL_CAPSULE | Freq: Once | ORAL | Status: AC
  Administered 2019-02-07: 500 mg via ORAL
  Filled 2019-02-07: qty 2

## 2019-02-07 MED ORDER — PREDNISONE 20 MG PO TABS
40.0000 mg | ORAL_TABLET | Freq: Once | ORAL | Status: AC
  Administered 2019-02-07: 40 mg via ORAL
  Filled 2019-02-07: qty 2

## 2019-02-07 MED ORDER — ONDANSETRON 4 MG PO TBDP
4.0000 mg | ORAL_TABLET | Freq: Once | ORAL | Status: AC
  Administered 2019-02-07: 4 mg via ORAL
  Filled 2019-02-07: qty 1

## 2019-02-07 MED ORDER — AMOXICILLIN 500 MG PO CAPS: 500.0000 mg | ORAL_CAPSULE | Freq: Three times a day (TID) | ORAL | 0 refills | Status: DC

## 2019-02-07 MED ORDER — PREDNISONE 20 MG PO TABS: 40.0000 mg | ORAL_TABLET | Freq: Every day | ORAL | 0 refills | Status: DC

## 2019-02-07 MED ORDER — IBUPROFEN 800 MG PO TABS
800.0000 mg | ORAL_TABLET | Freq: Once | ORAL | Status: AC
  Administered 2019-02-07: 800 mg via ORAL
  Filled 2019-02-07: qty 1

## 2019-02-07 NOTE — Discharge Instructions (Addendum)
Please continue salt water gargles.  Is ibuprofen every 6 hours for fever and/or aching.  Chloraseptic spray may also be helpful.  Use Amoxil 3 times daily.  Use prednisone daily with food.  Please wash hands frequently.  Use your mask.  Practice social distancing.  A COVID-19 test has been sent to the lab.  You will probably hear from this in 2 to 3 days.  Please maintain physical distancing until you receive your COVID-19 virus results.

## 2019-02-07 NOTE — ED Triage Notes (Signed)
Pt c/o sore throat & post nasal drip with associated cough. Hx of tonsillitis. Pt states this is common for her. Denies fever, SOB, or COVID exposure.

## 2019-02-07 NOTE — ED Notes (Addendum)
Pt refused the covid 19 test. Reviewed d/c instructions and encourage pt to self quarantine herself for 14 days and practice hand washing.

## 2019-02-07 NOTE — ED Provider Notes (Signed)
[facility] EMERGENCY DEPARTMENT Provider Note   CSN: [id number] Arrival date & time: 02/07/19  1359     History   Chief Complaint Chief Complaint  Patient presents with  . Sore Throat    HPI Pamela Wells is a 18 y.o. female.     Pt reports recurrent throat infections. Pt has hx of tonsil stones.  The history is provided by the patient.  Sore Throat This is a new problem. The current episode started more than 2 days ago. The problem occurs constantly. The problem has been gradually worsening. Pertinent negatives include no chest pain, no abdominal pain and no shortness of breath. The symptoms are aggravated by swallowing. Nothing relieves the symptoms. Treatments tried: salt water gargles. The treatment provided no relief.    Past Medical History:  Diagnosis Date  . Asthma    no inhaler use in the past 2 months. 09-08-17  . Food allergy   . GERD (gastroesophageal reflux disease)   . Migraines    migraines  . Pneumonia 2018  . Post-nasal drip    pt states "sinus disease"   . Urticaria     Patient Active Problem List   Diagnosis Date Noted  . Intractable chronic migraine without aura and without status migrainosus 02/03/2019  . 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 Moderate persistent asthma without complication 87/56/4332  . Seasonal and perennial allergic rhinitis 12/22/2017  . Dyspepsia   . Abdominal pain, RUQ (right upper quadrant) 09/03/2017  . Dysphagia 09/03/2017  . Multiple food allergies 07/17/2017  . 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 Constipation 07/17/2017    Past Surgical History:  Procedure Laterality Date  . ESOPHAGOGASTRODUODENOSCOPY N/A 09/25/2017   Procedure: ESOPHAGOGASTRODUODENOSCOPY (EGD);  Surgeon: Danie Binder, MD;  Location: [facility];  Service: Endoscopy;  Laterality: N/A;  9:30am  . TONGUE SURGERY     HAD FRENULUM CLIPPED     OB History    Gravida      Para      Term      Preterm      AB      Living  0     SAB       TAB      Ectopic      Multiple      Live Births           Obstetric Comments  Menses monthly. Occur each month. Menarche at age 15.          Home Medications    Prior to Admission medications   Medication Sig Start Date End Date Taking? Authorizing Provider  albuterol (PROVENTIL HFA;VENTOLIN HFA) 108 (90 Base) MCG/ACT inhaler Inhale 1-2 puffs into the lungs every 6 (six) hours as needed for wheezing or shortness of breath. 12/22/17   Valentina Shaggy, MD  albuterol (PROVENTIL) (2.5 MG/3ML) 0.083% nebulizer solution Take 3 mLs (2.5 mg total) by nebulization every 4 (four) hours as needed for wheezing or shortness of breath. 10/20/18   Valentina Shaggy, MD  amitriptyline (ELAVIL) 25 MG tablet Take 1-2 tablets (25-50 mg total) by mouth at bedtime. 02/03/19   Melvenia Beam, MD  cetirizine (ZYRTEC) 10 MG tablet Take 1 tablet (10 mg total) by mouth daily for 30 days. 10/21/18 02/03/19  Valentina Shaggy, MD  fluticasone furoate-vilanterol (BREO ELLIPTA) 100-25 MCG/INH AEPB Inhale 1 puff into the lungs daily. 10/21/18   Valentina Shaggy, MD  Fluticasone Propionate Truett Perna) 93 MCG/ACT EXHU Place 2 sprays into the nose 2 (  two) times a day. 11/24/18   Alfonse SpruceGallagher, Joel Louis, MD  montelukast (SINGULAIR) 10 MG tablet Take 1 tablet (10 mg total) by mouth at bedtime. 10/21/18   Alfonse SpruceGallagher, Joel Louis, MD  ondansetron (ZOFRAN-ODT) 4 MG disintegrating tablet Take 1 tablet (4 mg total) by mouth every 8 (eight) hours as needed for nausea. 02/03/19   Anson FretAhern, Antonia B, MD  rizatriptan (MAXALT-MLT) 10 MG disintegrating tablet Take 1 tablet (10 mg total) by mouth as needed for migraine. May repeat in 2 hours if needed 02/03/19   Anson FretAhern, Antonia B, MD  topiramate (TOPAMAX) 100 MG tablet Take 1 tablet (100 mg total) by mouth 2 (two) times daily. 02/03/19   Anson FretAhern, Antonia B, MD    Family History Family History  Problem Relation Age of Onset  . Hypertension Father   . Diabetes Father   .  Ovarian cancer Maternal Grandmother   . Stroke Maternal Grandfather   . Colon cancer Neg Hx   . Colon polyps Neg Hx     Social History Social History   Tobacco Use  . Smoking status: Never Smoker  . Smokeless tobacco: Never Used  Substance Use Topics  . Alcohol use: Not Currently    Comment: occ  . Drug use: No     Allergies   Cheese, Lactose intolerance (gi), Peanut-containing drug products, and Sesame oil   Review of Systems Review of Systems  Constitutional: Negative for activity change, appetite change, chills and fever.  HENT: Positive for congestion, ear pain, postnasal drip and sore throat. Negative for ear discharge, facial swelling, nosebleeds, rhinorrhea, sneezing and tinnitus.   Eyes: Negative for photophobia, pain and discharge.  Respiratory: Negative for cough, choking, shortness of breath and wheezing.   Cardiovascular: Negative for chest pain, palpitations and leg swelling.  Gastrointestinal: Negative for abdominal pain, blood in stool, constipation, diarrhea, nausea and vomiting.  Genitourinary: Negative for difficulty urinating, dysuria, flank pain, frequency and hematuria.  Musculoskeletal: Negative for back pain, gait problem, myalgias and neck pain.  Skin: Negative for color change, rash and wound.  Neurological: Negative for dizziness, seizures, syncope, facial asymmetry, speech difficulty, weakness and numbness.  Hematological: Negative for adenopathy. Does not bruise/bleed easily.  Psychiatric/Behavioral: Negative for agitation, confusion, hallucinations, self-injury and suicidal ideas. The patient is not nervous/anxious.      Physical Exam Updated Vital Signs BP 121/78 (BP Location: Right Arm)   Pulse 64   Temp 98.6 F (37 C) (Oral)   Resp 16   Ht 5\' 3"  (1.6 m)   Wt 73.9 kg   LMP 01/12/2019 (Approximate)   SpO2 100%   BMI 28.87 kg/m   Physical Exam Vitals signs and nursing note reviewed.  Constitutional:      Appearance: She is  well-developed. She is not toxic-appearing.  HENT:     Head: Normocephalic.     Right Ear: Tympanic membrane and external ear normal.     Left Ear: Tympanic membrane and external ear normal.     Mouth/Throat:     Tonsils: Tonsillar exudate present.  Eyes:     General: Lids are normal.     Pupils: Pupils are equal, round, and reactive to light.  Neck:     Musculoskeletal: Normal range of motion and neck supple.     Vascular: No carotid bruit.  Cardiovascular:     Rate and Rhythm: Normal rate and regular rhythm.     Pulses: Normal pulses.     Heart sounds: Normal heart sounds.  Pulmonary:  Effort: No respiratory distress.     Breath sounds: Normal breath sounds.  Abdominal:     General: Bowel sounds are normal.     Palpations: Abdomen is soft.     Tenderness: There is no abdominal tenderness. There is no guarding.  Musculoskeletal: Normal range of motion.  Lymphadenopathy:     Head:     Right side of head: No submandibular adenopathy.     Left side of head: No submandibular adenopathy.     Cervical: No cervical adenopathy.  Skin:    General: Skin is warm and dry.  Neurological:     Mental Status: She is alert and oriented to person, place, and time.     Cranial Nerves: No cranial nerve deficit.     Sensory: No sensory deficit.  Psychiatric:        Speech: Speech normal.      ED Treatments / Results  Labs (all labs ordered are listed, but only abnormal results are displayed) Labs Reviewed  GROUP A STREP BY PCR    EKG None  Radiology No results found.  Procedures Procedures (including critical care time)  Medications Ordered in ED Medications - No data to display   Initial Impression / Assessment and Plan / ED Course  I have reviewed the triage vital signs and the nursing notes.  Pertinent labs & imaging results that were available during my care of the patient were reviewed by me and considered in my medical decision making (see chart for details).           Final Clinical Impressions(s) / ED Diagnoses MDM  Vital signs are stable.  Pulse oximetry is 100% on room air.  Within normal limits by my interpretation.  The patient reports problem with sore throat, nasal drip, and some ear discomfort.  She has recurrent tonsillitis and pharyngitis issues.  She says she has been told that she has tonsillar stones.  Rapid strep was obtained.  No reported fever or chills.  Will obtain a COVID-19 viral test.  Patient will be treated with Amoxil, Decadron, and ibuprofen.  Patient to use saline gargles.  We discussed the importance of self distancing and good handwashing.  Patient advised to use a mask.  Patient acknowledges understanding of these instructions.   Final diagnoses:  Tonsillitis  Upper respiratory tract infection, unspecified type    ED Discharge Orders         Ordered    amoxicillin (AMOXIL) 500 MG capsule  3 times daily     02/07/19 1632    predniSONE (DELTASONE) 20 MG tablet  Daily     02/07/19 1632           Ivery QualeBryant, Andrianna Manalang, PA-C 02/07/19 1635    Samuel JesterMcManus, Kathleen, [place]DO 02/12/19 [phone]

## 2019-05-05 ENCOUNTER — Ambulatory Visit: Payer: Medicaid Other | Admitting: Family Medicine

## 2019-05-05 ENCOUNTER — Other Ambulatory Visit: Payer: Self-pay

## 2019-05-05 ENCOUNTER — Encounter: Payer: Self-pay | Admitting: Family Medicine

## 2019-05-05 VITALS — BP 110/69 | HR 80 | Temp 98.2°F | Ht 63.0 in | Wt 174.2 lb

## 2019-05-05 DIAGNOSIS — G43109 Migraine with aura, not intractable, without status migrainosus: Secondary | ICD-10-CM

## 2019-05-05 MED ORDER — AMITRIPTYLINE HCL 10 MG PO TABS: 10.0000 mg | ORAL_TABLET | Freq: Every day | ORAL | 3 refills | Status: DC

## 2019-05-05 NOTE — Progress Notes (Addendum)
PATIENT: Pamela Wells DOB: 2001-03-12  REASON FOR VISIT: follow up HISTORY FROM: patient  Chief Complaint  Patient presents with   Follow-up    3 mon f/u. Alone. Rm 1. Patient would like to discuss decreasing the dose of her Elavil.      HISTORY OF PRESENT ILLNESS: Today 05/05/19 Pamela Wells is a 18 y.o. female here today for follow up for migraines. She reports that headaches have improved significantly since starting topiramate  twice daily. She continues amitriptyline  at night. She feels that there are too many days when she wakes up feeling groggy. She does not sleep well. She is in college studying nursing and has a fluctuating sleep schedule.  She has taken rizatriptan twice since July.  This medication works very well to abort migraines.  She denies any adverse effects with medication.  She has scheduled follow-up with PCP and GYN to discuss appropriate forms of birth control.   HISTORY: (copied from Dr Trevor Mace note on 02/03/2019)  HPI:  Pamela Wells is a 18 y.o. female here as requested by the emergency room for migraines. She has a PMHx of migraines and had a workup including MRI of the brain without etiology. She tried Melatonin , she is not sleeping. She tried Fioricet, Topamax, she stopped the Topamax because she had to be on antibiotics and felt the combo made her feel bad, she restarted Topamax and still on it, likely  once daily (in prior notes was given Topamax  twice daily with increase to  bid but she never did that and only on  once daily now. She went to the ED recently due to severe migraines, she does get left arm and leg numbness and tingling but her whole left side was painful, she couldn't move her neck. She did NOT take her medications when the migraine started and then went to ED. Imitrex did not work. SHe has migraines with nausea, migraine on the left side, throbbing in the back and in the neck, light and sound  sensitivity, ongoing for a year. No family history. She can have dry heaves and nausea. They can be moderately severe to severe. She does not have a pcp, she needs one asked her. She has an aura, dots, precedes the migraine and also get sensory changes in the left side, she has dizziness. No OTC medication.   Reviewed notes, labs and imaging from outside physicians, which showed:  tsh normal  Cb/cmp with elevated gkucose  Reviewed report 08/2018 MRI brain w/wo cont: MRI brain normal. Sinus disease.    REVIEW OF SYSTEMS: Out of a complete 14 system review of symptoms, the patient complains only of the following symptoms, memory loss, dizziness, headache, numbness, speech difficulty and all other reviewed systems are negative.   ALLERGIES: Allergies  Allergen Reactions   Cheese Hives   Lactose Intolerance (Gi)    Peanut-Containing Drug Products Hives   Sesame Oil     HOME MEDICATIONS: Outpatient Medications Prior to Visit  Medication Sig Dispense Refill   albuterol (PROVENTIL HFA;VENTOLIN HFA) 108 (90 Base) MCG/ACT inhaler Inhale 1-2 puffs into the lungs every 6 (six) hours as needed for wheezing or shortness of breath. 1 Inhaler 1   albuterol (PROVENTIL) (2.5 MG/3ML) 0.083% nebulizer solution Take 3 mLs (2.5 mg total) by nebulization every 4 (four) hours as needed for wheezing or shortness of breath. 75 mL 1   fluticasone furoate-vilanterol (BREO ELLIPTA) 100-25 MCG/INH AEPB Inhale 1 puff into the lungs daily. 30 each  5   Fluticasone Propionate (XHANCE) 93 MCG/ACT EXHU Place 2 sprays into the nose 2 (two) times a day. 16 mL 5   montelukast (SINGULAIR) 10 MG tablet Take 1 tablet (10 mg total) by mouth at bedtime. 30 tablet 5   ondansetron (ZOFRAN-ODT) 4 MG disintegrating tablet Take 1 tablet (4 mg total) by mouth every 8 (eight) hours as needed for nausea. 30 tablet 3   rizatriptan (MAXALT-MLT) 10 MG disintegrating tablet Take 1 tablet (10 mg total) by mouth as needed for  migraine. May repeat in 2 hours if needed 9 tablet 11   topiramate (TOPAMAX) 100 MG tablet Take 1 tablet (100 mg total) by mouth 2 (two) times daily. 90 tablet 6   amitriptyline (ELAVIL) 25 MG tablet Take 1-2 tablets (25-50 mg total) by mouth at bedtime. 60 tablet 6   amoxicillin (AMOXIL) 500 MG capsule Take 1 capsule (500 mg total) by mouth 3 (three) times daily. (Patient not taking: Reported on 05/05/2019) 21 capsule 0   cetirizine (ZYRTEC) 10 MG tablet Take 1 tablet (10 mg total) by mouth daily for 30 days. 30 tablet 5   predniSONE (DELTASONE) 20 MG tablet Take 2 tablets (40 mg total) by mouth daily. 10 tablet 0   No facility-administered medications prior to visit.     PAST MEDICAL HISTORY: Past Medical History:  Diagnosis Date   Asthma    no inhaler use in the past 2 months. 09-08-17   Food allergy    GERD (gastroesophageal reflux disease)    Migraines    migraines   Pneumonia 2018   Post-nasal drip    pt states "sinus disease"    Urticaria     PAST SURGICAL HISTORY: Past Surgical History:  Procedure Laterality Date   ESOPHAGOGASTRODUODENOSCOPY N/A 09/25/2017   Procedure: ESOPHAGOGASTRODUODENOSCOPY (EGD);  Surgeon: West BaliFields, Sandi L, MD;  Location: [facility];  Service: Endoscopy;  Laterality: N/A;  9:30am   TONGUE SURGERY     HAD FRENULUM CLIPPED    FAMILY HISTORY: Family History  Problem Relation Age of Onset   Hypertension Father    Diabetes Father    Ovarian cancer Maternal Grandmother    Stroke Maternal Grandfather    Colon cancer Neg Hx    Colon polyps Neg Hx     SOCIAL HISTORY: Social History   Socioeconomic History   Marital status: Single    Spouse name: Not on file   Number of children: 1   Years of education: Not on file   Highest education level: Some college, no degree  Occupational History    Comment: Psychologist, 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[employer], [employer]  Social Needs   Financial resource strain: Not on file   Food insecurity    Worry: Not on  file    Inability: Not on file   Transportation needs    Medical: Not on file    Non-medical: Not on file  Tobacco Use   Smoking status: Never Smoker   Smokeless tobacco: Never Used  Substance and Sexual Activity   Alcohol use: Not Currently    Comment: occ   Drug use: No   Sexual activity: Never  Lifestyle   Physical activity    Days per week: Not on file    Minutes per session: Not on file   Stress: Not on file  Relationships   Social connections    Talks on phone: Not on file    Gets together: Not on file    Attends religious service: Not on file  Active member of club or organization: Not on file    Attends meetings of clubs or organizations: Not on file    Relationship status: Not on file   Intimate partner violence    Fear of current or ex partner: Not on file    Emotionally abused: Not on file    Physically abused: Not on file    Forced sexual activity: Not on file  Other Topics Concern   Not on file  Social History Narrative   In college, [place]    Eats all food groups.   Wears seatbelt.    Drives a car.    Does not smoke.    Reports that is religious and believes in God.   Reports she is not sexually active.  Denies any tobacco, alcohol, or drug use.      PHYSICAL EXAM  Vitals:   05/05/19 0957  BP: 110/69  Pulse: 80  Temp: 98.2 F (36.8 C)  TempSrc: Oral  Weight: 174 lb 3.2 oz (79 kg)  Height: 5\' 3"  (1.6 m)   Body mass index is 30.86 kg/m.  Generalized: Well developed, in no acute distress  Cardiology: normal rate and rhythm, no murmur noted Neurological examination  Mentation: Alert oriented to time, place, history taking. Follows all commands speech and language fluent Cranial nerve II-XII: Pupils were equal round reactive to light. Extraocular movements were full, visual field were full on confrontational test. Facial sensation and strength were normal. Uvula tongue midline. Head turning and shoulder shrug  were normal and  symmetric. Motor: The motor testing reveals 5 over 5 strength of all 4 extremities. Good symmetric motor tone is noted throughout.  Gait and station: Gait is normal.   DIAGNOSTIC DATA (LABS, IMAGING, TESTING) - I reviewed patient records, labs, notes, testing and imaging myself where available.  No flowsheet data found.   Lab Results  Component Value Date   WBC 6.3 01/10/2019   HGB 12.8 01/10/2019   HCT 38.4 01/10/2019   MCV 92.1 01/10/2019   PLT 226 01/10/2019      Component Value Date/Time   NA 138 01/10/2019 2306   K 3.7 01/10/2019 2306   CL 104 01/10/2019 2306   CO2 23 01/10/2019 2306   GLUCOSE 110 (H) 01/10/2019 2306   BUN 16 01/10/2019 2306   CREATININE 0.65 01/10/2019 2306   CREATININE 0.61 07/17/2017 1022   CALCIUM 9.0 01/10/2019 2306   PROT 7.4 07/17/2017 1022   AST 17 07/17/2017 1022   ALT 12 07/17/2017 1022   BILITOT 0.7 07/17/2017 1022   GFRNONAA >60 01/10/2019 2306   GFRAA >60 01/10/2019 2306   No results found for: CHOL, HDL, LDLCALC, LDLDIRECT, TRIG, CHOLHDL Lab Results  Component Value Date   HGBA1C 5.3 02/03/2019   No results found for: VITAMINB12 Lab Results  Component Value Date   TSH 0.65 07/17/2017     ASSESSMENT AND PLAN 18 y.o. year old female  has a past medical history of Asthma, Food allergy, GERD (gastroesophageal reflux disease), Migraines, Pneumonia (2018), Post-nasal drip, and Urticaria. here with     ICD-10-CM   1. Migraine with aura and without status migrainosus, not intractable  G43.109     Naima is doing much better from a migraine perspective.  Topiramate has significantly reduced frequency and severity of headaches.  She reports having about 2 headaches per week.  She is concerned about increased grogginess on amitriptyline.  We will reduce dose to 10 mg at bedtime.  Sleep hygiene reviewed.  We will assess response and consider alternate preventive migraines if needed.  She will follow-up with primary care as directed. She  will discuss birth control options with GYN.  She is aware of potential increased risk of stroke due to history of migraines with aura.  She was advised against pregnancy while on topiramate.  She will follow-up with [place] in 6 months, sooner if needed.  She verbalizes understanding and agreement with this plan.   No orders of the defined types were placed in this encounter.    Meds ordered this encounter  Medications   amitriptyline (ELAVIL) 10 MG tablet    Sig: Take 1 tablet (10 mg total) by mouth at bedtime.    Dispense:  90 tablet    Refill:  3    Order Specific Question:   Supervising Provider    Answer:   Melvenia Beam [id number]      I spent 15 minutes with the patient. 50% of this time was spent counseling and educating patient on plan of care and medications.    Debbora Presto, FNP-C 05/05/2019, 12:44 PM [facility] [address] [place], [place] [address] [phone]  Made any corrections needed, and agree with history, physical, neuro exam,assessment and plan as stated.     Sarina Ill, MD [facility]

## 2019-05-05 NOTE — Patient Instructions (Signed)
Continue topiramate 100mg  twice daily  We will decrease amitriptyline to 10mg  at bedtime  Follow up in 6 months, sooner if needed    Migraine Headache A migraine headache is a very strong throbbing pain on one side or both sides of your head. This type of headache can also cause other symptoms. It can last from 4 hours to 3 days. Talk with your doctor about what things may bring on (trigger) this condition. What are the causes? The exact cause of this condition is not known. This condition may be triggered or caused by:  Drinking alcohol.  Smoking.  Taking medicines, such as: ? Medicine used to treat chest pain (nitroglycerin). ? Birth control pills. ? Estrogen. ? Some blood pressure medicines.  Eating or drinking certain products.  Doing physical activity. Other things that may trigger a migraine headache include:  Having a menstrual period.  Pregnancy.  Hunger.  Stress.  Not getting enough sleep or getting too much sleep.  Weather changes.  Tiredness (fatigue). What increases the risk?  Being 12-74 years old.  Being female.  Having a family history of migraine headaches.  Being Caucasian.  Having depression or anxiety.  Being very overweight. What are the signs or symptoms?  A throbbing pain. This pain may: ? Happen in any area of the head, such as on one side or both sides. ? Make it hard to do daily activities. ? Get worse with physical activity. ? Get worse around bright lights or loud noises.  Other symptoms may include: ? Feeling sick to your stomach (nauseous). ? Vomiting. ? Dizziness. ? Being sensitive to bright lights, loud noises, or smells.  Before you get a migraine headache, you may get warning signs (an aura). An aura may include: ? Seeing flashing lights or having blind spots. ? Seeing bright spots, halos, or zigzag lines. ? Having tunnel vision or blurred vision. ? Having numbness or a tingling feeling. ? Having trouble talking.  ? Having weak muscles.  Some people have symptoms after a migraine headache (postdromal phase), such as: ? Tiredness. ? Trouble thinking (concentrating). How is this treated?  Taking medicines that: ? Relieve pain. ? Relieve the feeling of being sick to your stomach. ? Prevent migraine headaches.  Treatment may also include: ? Having acupuncture. ? Avoiding foods that bring on migraine headaches. ? Learning ways to control your body functions (biofeedback). ? Therapy to help you know and deal with negative thoughts (cognitive behavioral therapy). Follow these instructions at home: Medicines  Take over-the-counter and prescription medicines only as told by your doctor.  Ask your doctor if the medicine prescribed to you: ? Requires you to avoid driving or using heavy machinery. ? Can cause trouble pooping (constipation). You may need to take these steps to prevent or treat trouble pooping:  Drink enough fluid to keep your pee (urine) pale yellow.  Take over-the-counter or prescription medicines.  Eat foods that are high in fiber. These include beans, whole grains, and fresh fruits and vegetables.  Limit foods that are high in fat and sugar. These include fried or sweet foods. Lifestyle  Do not drink alcohol.  Do not use any products that contain nicotine or tobacco, such as cigarettes, e-cigarettes, and chewing tobacco. If you need help quitting, ask your doctor.  Get at least 8 hours of sleep every night.  Limit and deal with stress. General instructions      Keep a journal to find out what may bring on your migraine headaches. For  example, write down: ? What you eat and drink. ? How much sleep you get. ? Any change in what you eat or drink. ? Any change in your medicines.  If you have a migraine headache: ? Avoid things that make your symptoms worse, such as bright lights. ? It may help to lie down in a dark, quiet room. ? Do not drive or use heavy machinery.  ? Ask your doctor what activities are safe for you.  Keep all follow-up visits as told by your doctor. This is important. Contact a doctor if:  You get a migraine headache that is different or worse than others you have had.  You have more than 15 headache days in one month. Get help right away if:  Your migraine headache gets very bad.  Your migraine headache lasts longer than 72 hours.  You have a fever.  You have a stiff neck.  You have trouble seeing.  Your muscles feel weak or like you cannot control them.  You start to lose your balance a lot.  You start to have trouble walking.  You pass out (faint).  You have a seizure. Summary  A migraine headache is a very strong throbbing pain on one side or both sides of your head. These headaches can also cause other symptoms.  This condition may be treated with medicines and changes to your lifestyle.  Keep a journal to find out what may bring on your migraine headaches.  Contact a doctor if you get a migraine headache that is different or worse than others you have had.  Contact your doctor if you have more than 15 headache days in a month. This information is not intended to replace advice given to you by your health care provider. Make sure you discuss any questions you have with your health care provider. Document Released: 04/01/2008 Document Revised: 10/15/2018 Document Reviewed: 08/05/2018 Elsevier Patient Education  2020 [employer].

## 2019-05-12 ENCOUNTER — Other Ambulatory Visit: Payer: Self-pay

## 2019-05-12 ENCOUNTER — Ambulatory Visit: Payer: BLUE CROSS/BLUE SHIELD | Admitting: Family Medicine

## 2019-05-12 DIAGNOSIS — Z20822 Contact with and (suspected) exposure to covid-19: Secondary | ICD-10-CM

## 2019-05-13 LAB — NOVEL CORONAVIRUS, NAA: SARS-CoV-2, NAA: NOT DETECTED

## 2019-05-30 ENCOUNTER — Encounter (HOSPITAL_COMMUNITY): Payer: Self-pay | Admitting: Emergency Medicine

## 2019-05-30 ENCOUNTER — Emergency Department (HOSPITAL_COMMUNITY)
Admission: EM | Admit: 2019-05-30 | Discharge: 2019-05-31 | Disposition: A | Discharge status: Home / Self Care | Payer: Medicaid Other | Attending: Emergency Medicine | Admitting: Emergency Medicine

## 2019-05-30 ENCOUNTER — Other Ambulatory Visit: Payer: Self-pay

## 2019-05-30 DIAGNOSIS — Z91010 Allergy to peanuts: Secondary | ICD-10-CM | POA: Diagnosis not present

## 2019-05-30 DIAGNOSIS — Z79899 Other long term (current) drug therapy: Secondary | ICD-10-CM | POA: Insufficient documentation

## 2019-05-30 DIAGNOSIS — J45909 Unspecified asthma, uncomplicated: Secondary | ICD-10-CM | POA: Insufficient documentation

## 2019-05-30 DIAGNOSIS — J329 Chronic sinusitis, unspecified: Secondary | ICD-10-CM

## 2019-05-30 DIAGNOSIS — R519 Headache, unspecified: Secondary | ICD-10-CM | POA: Diagnosis present

## 2019-05-30 NOTE — ED Triage Notes (Signed)
Pt c/o sinus pressure and left ear pain x 3 weeks, cannot get appt with PCP until December, denies taking OTC meds

## 2019-05-30 NOTE — ED Provider Notes (Signed)
[facility] EMERGENCY DEPARTMENT Provider Note   CSN: [id number] Arrival date & time: 05/30/19  2200     History   Chief Complaint Chief Complaint  Patient presents with   Facial Pain    HPI Pamela Wells is a 18 y.o. female.     HPI  She complains of nasal congestion and left ear pain for several days.  She thinks that she has a sinus infection, it typically occurs this time a year.  She denies fever, chills, cough, shortness of breath, weakness or dizziness.  There are no other known modifying factors.   Past Medical History:  Diagnosis Date   Asthma    no inhaler use in the past 2 months. 09-08-17   Food allergy    GERD (gastroesophageal reflux disease)    Migraines    migraines   Pneumonia 2018   Post-nasal drip    pt states "sinus disease"    Urticaria     Patient Active Problem List   Diagnosis Date Noted   Intractable chronic migraine without aura and without status migrainosus 02/03/2019   Basilar migraine 02/03/2019   Moderate persistent asthma without complication 12/22/2017   Seasonal and perennial allergic rhinitis 12/22/2017   Dyspepsia    Abdominal pain, RUQ (right upper quadrant) 09/03/2017   Dysphagia 09/03/2017   Multiple food allergies 07/17/2017   Skin pigmentation disorder 07/17/2017   Constipation 07/17/2017    Past Surgical History:  Procedure Laterality Date   ESOPHAGOGASTRODUODENOSCOPY N/A 09/25/2017   Procedure: ESOPHAGOGASTRODUODENOSCOPY (EGD);  Surgeon: West Bali, MD;  Location: [facility] ENDO SUITE;  Service: Endoscopy;  Laterality: N/A;  9:30am   TONGUE SURGERY     HAD FRENULUM CLIPPED     OB History    Gravida      Para      Term      Preterm      AB      Living  0     SAB      TAB      Ectopic      Multiple      Live Births           Obstetric Comments  Menses monthly. Occur each month. Menarche at age 43.          Home Medications    Prior to Admission medications     Medication Sig Start Date End Date Taking? Authorizing Provider  albuterol (PROVENTIL HFA;VENTOLIN HFA) 108 (90 Base) MCG/ACT inhaler Inhale 1-2 puffs into the lungs every 6 (six) hours as needed for wheezing or shortness of breath. 12/22/17   Alfonse Spruce, MD  albuterol (PROVENTIL) (2.5 MG/3ML) 0.083% nebulizer solution Take 3 mLs (2.5 mg total) by nebulization every 4 (four) hours as needed for wheezing or shortness of breath. 10/20/18   Alfonse Spruce, MD  amitriptyline (ELAVIL) 10 MG tablet Take 1 tablet (10 mg total) by mouth at bedtime. 05/05/19   Lomax, Amy, NP  fluticasone furoate-vilanterol (BREO ELLIPTA) 100-25 MCG/INH AEPB Inhale 1 puff into the lungs daily. 10/21/18   Alfonse Spruce, MD  Fluticasone Propionate Timmothy Sours) 93 MCG/ACT EXHU Place 2 sprays into the nose 2 (two) times a day. 11/24/18   Alfonse Spruce, MD  montelukast (SINGULAIR) 10 MG tablet Take 1 tablet (10 mg total) by mouth at bedtime. 10/21/18   Alfonse Spruce, MD  ondansetron (ZOFRAN-ODT) 4 MG disintegrating tablet Take 1 tablet (4 mg total) by mouth every 8 (eight) hours as needed for  nausea. 02/03/19   Anson FretAhern, Antonia B, MD  rizatriptan (MAXALT-MLT) 10 MG disintegrating tablet Take 1 tablet (10 mg total) by mouth as needed for migraine. May repeat in 2 hours if needed 02/03/19   Anson FretAhern, Antonia B, MD  topiramate (TOPAMAX) 100 MG tablet Take 1 tablet (100 mg total) by mouth 2 (two) times daily. 02/03/19   Anson FretAhern, Antonia B, MD    Family History Family History  Problem Relation Age of Onset   Hypertension Father    Diabetes Father    Ovarian cancer Maternal Grandmother    Stroke Maternal Grandfather    Colon cancer Neg Hx    Colon polyps Neg Hx     Social History Social History   Tobacco Use   Smoking status: Never Smoker   Smokeless tobacco: Never Used  Substance Use Topics   Alcohol use: Not Currently    Comment: occ   Drug use: No     Allergies   Cheese, Lactose  intolerance (gi), Peanut-containing drug products, and Sesame oil   Review of Systems Review of Systems  All other systems reviewed and are negative.    Physical Exam Updated Vital Signs BP 122/71 (BP Location: Right Arm)    Pulse 65    Temp 98.2 F (36.8 C) (Oral)    Resp 17    Ht 5\' 3"  (1.6 m)    Wt 77.1 kg    LMP 05/18/2019    SpO2 100%    BMI 30.11 kg/m   Physical Exam Vitals signs and nursing note reviewed.  Constitutional:      General: She is not in acute distress.    Appearance: She is well-developed. She is not ill-appearing, toxic-appearing or diaphoretic.  HENT:     Head: Normocephalic and atraumatic.     Right Ear: External ear normal.     Left Ear: External ear normal.     Nose: Congestion present. No rhinorrhea.     Mouth/Throat:     Mouth: Mucous membranes are moist.  Eyes:     Conjunctiva/sclera: Conjunctivae normal.     Pupils: Pupils are equal, round, and reactive to light.  Neck:     Musculoskeletal: Normal range of motion and neck supple.     Trachea: Phonation normal.  Cardiovascular:     Rate and Rhythm: Normal rate and regular rhythm.     Heart sounds: Normal heart sounds.  Pulmonary:     Effort: Pulmonary effort is normal.     Breath sounds: Normal breath sounds.  Abdominal:     Palpations: Abdomen is soft.     Tenderness: There is no abdominal tenderness.  Musculoskeletal: Normal range of motion.  Skin:    General: Skin is warm and dry.  Neurological:     Mental Status: She is alert and oriented to person, place, and time.     Cranial Nerves: No cranial nerve deficit.     Sensory: No sensory deficit.     Motor: No abnormal muscle tone.     Coordination: Coordination normal.  Psychiatric:        Mood and Affect: Mood normal.        Behavior: Behavior normal.        Thought Content: Thought content normal.        Judgment: Judgment normal.      ED Treatments / Results  Labs (all labs ordered are listed, but only abnormal results are  displayed) Labs Reviewed - No data to display  EKG None  Radiology No results found.  Procedures Procedures (including critical care time)  Medications Ordered in ED Medications - No data to display   Initial Impression / Assessment and Plan / ED Course  I have reviewed the triage vital signs and the nursing notes.  Pertinent labs & imaging results that were available during my care of the patient were reviewed by me and considered in my medical decision making (see chart for details).         Patient Vitals for the past 24 hrs:  BP Temp Temp src Pulse Resp SpO2 Height Weight  05/30/19 2212 -- -- -- -- -- -- 5\' 3"  (1.6 m) 77.1 kg  05/30/19 2211 122/71 98.2 F (36.8 C) Oral 65 17 100 % -- --    12:11 AM Reevaluation with update and discussion. After initial assessment and treatment, an updated evaluation reveals no change in clinical status, findings discussed with the patient all questions were answered. Daleen Bo   Medical Decision Making: Signs and symptoms of acute sinusitis, possibly bacterial related.  Doubt severe sepsis, or metabolic instability.  No indication for further ED evaluation and treatment.  CRITICAL CARE- No Performed by: Daleen Bo  Nursing Notes Reviewed/ Care Coordinated Applicable Imaging Reviewed Interpretation of Laboratory Data incorporated into ED treatment  The patient appears reasonably screened and/or stabilized for discharge and I doubt any other medical condition or other [facility] requiring further screening, evaluation, or treatment in the ED at this time prior to discharge.  Plan: Home Medications-OTC analgesia of choice; Home Treatments-rest, fluids; return here if the recommended treatment, does not improve the symptoms; Recommended follow up-PCP, as needed   Final Clinical Impressions(s) / ED Diagnoses   Final diagnoses:  None    ED Discharge Orders    None       Daleen Bo, MD 05/31/19 0011

## 2019-05-31 MED ORDER — AMOXICILLIN 500 MG PO CAPS: 500.0000 mg | ORAL_CAPSULE | Freq: Three times a day (TID) | ORAL | 0 refills | Status: DC

## 2019-05-31 NOTE — Discharge Instructions (Addendum)
Take the antibiotic prescription that was sent to your pharmacy.  Additionally you can use your steroid nasal spray to help with the congestion.  For symptomatic relief you can use Sudafed, combined with Afrin, for nasal congestion symptoms.  For pain or fever use Motrin or Tylenol.  See your doctor if not better in 1 week.

## 2019-06-29 ENCOUNTER — Encounter: Payer: Self-pay | Admitting: Family Medicine

## 2019-07-05 ENCOUNTER — Ambulatory Visit: Payer: Medicaid Other | Admitting: Family Medicine

## 2019-07-12 ENCOUNTER — Ambulatory Visit (INDEPENDENT_AMBULATORY_CARE_PROVIDER_SITE_OTHER): Payer: Medicaid Other | Admitting: Family Medicine

## 2019-07-12 ENCOUNTER — Other Ambulatory Visit: Payer: Self-pay

## 2019-07-12 ENCOUNTER — Encounter: Payer: Self-pay | Admitting: Family Medicine

## 2019-07-12 VITALS — BP 104/80 | HR 68 | Temp 99.2°F | Resp 15 | Ht 63.0 in | Wt 185.0 lb

## 2019-07-12 DIAGNOSIS — R0989 Other specified symptoms and signs involving the circulatory and respiratory systems: Secondary | ICD-10-CM | POA: Diagnosis not present

## 2019-07-12 DIAGNOSIS — N920 Excessive and frequent menstruation with regular cycle: Secondary | ICD-10-CM | POA: Insufficient documentation

## 2019-07-12 DIAGNOSIS — J3089 Other allergic rhinitis: Secondary | ICD-10-CM | POA: Diagnosis not present

## 2019-07-12 DIAGNOSIS — J302 Other seasonal allergic rhinitis: Secondary | ICD-10-CM

## 2019-07-12 DIAGNOSIS — M2142 Flat foot [pes planus] (acquired), left foot: Secondary | ICD-10-CM

## 2019-07-12 DIAGNOSIS — M2141 Flat foot [pes planus] (acquired), right foot: Secondary | ICD-10-CM

## 2019-07-12 HISTORY — DX: Excessive and frequent menstruation with regular cycle: N92.0

## 2019-07-12 MED ORDER — AMOXICILLIN-POT CLAVULANATE 875-125 MG PO TABS: 1.0000 | ORAL_TABLET | Freq: Two times a day (BID) | ORAL | 0 refills | Status: AC

## 2019-07-12 MED ORDER — LEVOCETIRIZINE DIHYDROCHLORIDE 5 MG PO TABS: 5.0000 mg | ORAL_TABLET | Freq: Every evening | ORAL | 3 refills | Status: DC

## 2019-07-12 NOTE — Assessment & Plan Note (Signed)
Reports bilateral flatfoot.  Reports that she has a hard time standing and running or doing any activities.  Would like a referral to orthopedics to see if there is anything they can do.  Provided with referral to orthopedics appreciate collaboration of care please let us know if there is anything that PCP can do.

## 2019-07-12 NOTE — Patient Instructions (Signed)
I appreciate the opportunity to provide you with care for your health and wellness. Today we discussed: established care  Follow up: 6 months for annual no pap  No labs   Referrals for orthopedics, ENT, and OB-GYN placed today. I have sent in xyzal and Augmentin for your sinuses  Have a good semester at school.  Please continue to practice social distancing to keep you, your family, and our community safe.  If you must go out, please wear a mask and practice good handwashing.  It was a pleasure to see you and I look forward to continuing to work together on your health and well-being. Please do not hesitate to call the office if you need care or have questions about your care.  Have a wonderful day and week. With Gratitude, Tereasa Coop, DNP, AGNP-BC

## 2019-07-12 NOTE — Assessment & Plan Note (Signed)
Heavy cycles, reports bad cramps. Would like to see GYN about this.  Referral provided today.  Please let PCP know if there is anything that we can do appreciate collaboration in her care.

## 2019-07-12 NOTE — Progress Notes (Signed)
Subjective:  Patient ID: Pamela Wells, female    DOB: 24-Jun-2001  Age: 19 y.o. MRN: [id number]  CC:  Chief Complaint  Patient presents with  . New Patient (Initial Visit)    establish care, would like referrals for ENT, ob/gyn, ortho      HPI  HPI  Pamela Wells is a 19 year old female patient who is here to establish care.  She is followed by a neurologist for migraines and allergist for asthma and allergy.  Would like a referral to ENT, OB/GYN, and Ortho.  Declines a flu shot today.  Reports that her asthma is well controlled.  She takes all of her current medications as directed.  Is about to go back to school wanted to get established here so she can get referrals to make sure that she get set up before heading back.  ENT: Had a CT maxillary back in July.  Reports states that she has acute sinusitis with some thickening never got back in with the ENT.  Reports that she just continues to have this ongoing drainage and would like to be seen to make sure there is nothing in there that could be handled.  OB/GYN: Reports that she has heavy.  Cramping is bad.  Would like to see about getting on something to help with management of this.  Ortho: Has flat feet bilaterally reports that is causing her to have hip pain.  When she tries to work out and walk for long periods of time.  Possible need for inserts but she is unsure if that still I think she might need.   Today patient denies signs and symptoms of COVID 19 infection including fever, chills, cough, shortness of breath, and headache. Past Medical, Surgical, Social History, Allergies, and Medications have been Reviewed.   Past Medical History:  Diagnosis Date  . Asthma    no inhaler use in the past 2 months. 09-08-17  . Food allergy   . GERD (gastroesophageal reflux disease)   . Migraines    migraines  . Pneumonia 2018  . Post-nasal drip    pt states "sinus disease"   . Urticaria     Current Meds  Medication Sig    . albuterol (PROVENTIL HFA;VENTOLIN HFA) 108 (90 Base) MCG/ACT inhaler Inhale 1-2 puffs into the lungs every 6 (six) hours as needed for wheezing or shortness of breath.  Marland Kitchen albuterol (PROVENTIL) (2.5 MG/3ML) 0.083% nebulizer solution Take 3 mLs (2.5 mg total) by nebulization every 4 (four) hours as needed for wheezing or shortness of breath.  Marland Kitchen amitriptyline (ELAVIL) 10 MG tablet Take 1 tablet (10 mg total) by mouth at bedtime.  . fluticasone furoate-vilanterol (BREO ELLIPTA) 100-25 MCG/INH AEPB Inhale 1 puff into the lungs daily.  . Fluticasone Propionate (XHANCE) 93 MCG/ACT EXHU Place 2 sprays into the nose 2 (two) times a day.  . montelukast (SINGULAIR) 10 MG tablet Take 1 tablet (10 mg total) by mouth at bedtime.  . ondansetron (ZOFRAN-ODT) 4 MG disintegrating tablet Take 1 tablet (4 mg total) by mouth every 8 (eight) hours as needed for nausea.  . rizatriptan (MAXALT-MLT) 10 MG disintegrating tablet Take 1 tablet (10 mg total) by mouth as needed for migraine. May repeat in 2 hours if needed  . topiramate (TOPAMAX) 100 MG tablet Take 1 tablet (100 mg total) by mouth 2 (two) times daily.    ROS:  Review of Systems  Constitutional: Negative.   HENT: Positive for congestion and sinus pain.   Eyes:  Negative.   Respiratory: Negative.   Cardiovascular: Negative.   Gastrointestinal: Negative.   Genitourinary: Negative.   Musculoskeletal: Negative.   Skin: Negative.   Neurological: Negative.   Endo/Heme/Allergies: Negative.   Psychiatric/Behavioral: Negative.   All other systems reviewed and are negative.    Objective:   Today's Vitals: BP 104/80   Pulse 68   Temp 99.2 F (37.3 C) (Oral)   Resp 15   Ht 5\' 3"  (1.6 m)   Wt 185 lb (83.9 kg)   SpO2 97%   BMI 32.77 kg/m  Vitals with BMI 07/12/2019 05/30/2019 05/05/2019  Height 5\' 3"  5\' 3"  5\' 3"   Weight 185 lbs 170 lbs 174 lbs 3 oz  BMI 32.78 30.12 30.87  Systolic 104 122 05/07/2019  Diastolic 80 71 69  Pulse 68 65 80     Physical  Exam Vitals and nursing note reviewed.  Constitutional:      Appearance: Normal appearance. She is well-developed and well-groomed. She is obese.  HENT:     Head: Normocephalic and atraumatic.     Right Ear: External ear normal.     Left Ear: External ear normal.     Nose: Mucosal edema, congestion and rhinorrhea present.     Right Turbinates: Enlarged.     Left Turbinates: Enlarged.     Right Sinus: Maxillary sinus tenderness present. No frontal sinus tenderness.     Left Sinus: Maxillary sinus tenderness present. No frontal sinus tenderness.     Mouth/Throat:     Lips: Pink.     Mouth: Mucous membranes are moist.     Pharynx: Oropharynx is clear. Uvula midline.     Tonsils: No tonsillar exudate. 3+ on the right. 0 on the left.  Eyes:     General:        Right eye: No discharge.        Left eye: No discharge.     Conjunctiva/sclera: Conjunctivae normal.  Cardiovascular:     Rate and Rhythm: Normal rate and regular rhythm.     Pulses: Normal pulses.     Heart sounds: Normal heart sounds.  Pulmonary:     Effort: Pulmonary effort is normal.     Breath sounds: Normal breath sounds.  Musculoskeletal:        General: Normal range of motion.     Cervical back: Normal range of motion and neck supple.  Skin:    General: Skin is warm.  Neurological:     General: No focal deficit present.     Mental Status: She is alert and oriented to person, place, and time.  Psychiatric:        Attention and Perception: Attention normal.        Mood and Affect: Mood normal.        Speech: Speech normal.        Behavior: Behavior normal. Behavior is cooperative.        Thought Content: Thought content normal.        Cognition and Memory: Cognition normal.        Judgment: Judgment normal.     Assessment   1. Chronic sinus complaints   2. Menorrhagia with regular cycle   3. Flat feet, bilateral   4. Seasonal and perennial allergic rhinitis     Tests ordered Orders Placed This  Encounter  Procedures  . Ambulatory referral to Orthopedic Surgery  . Ambulatory referral to Obstetrics / Gynecology  . Ambulatory referral to ENT     Plan:  Please see assessment and plan per problem list above.   Meds ordered this encounter  Medications  . levocetirizine (XYZAL) 5 MG tablet    Sig: Take 1 tablet (5 mg total) by mouth every evening.    Dispense:  30 tablet    Refill:  3    Order Specific Question:   Supervising Provider    Answer:   SIMPSON, MARGARET E [[id number]]  . amoxicillin-clavulanate (AUGMENTIN) 875-125 MG tablet    Sig: Take 1 tablet by mouth 2 (two) times daily for 5 days.    Dispense:  10 tablet    Refill:  0    Order Specific Question:   Supervising Provider    Answer:   Fayrene Helper [[id number]]    Patient to follow-up in 6 months for annual no pap.  Perlie Mayo, NP

## 2019-07-12 NOTE — Assessment & Plan Note (Addendum)
Complaints and signs and symptoms of chronic sinus issues and pain.  Does have some enlargement in her tonsil in addition to some turbinate swelling and congestion and mild frontal/ethmoid maxillary pain.  Prescribed Augmentin for treatment.  Additionally was sent to ENT for chronic thickening of mucosa.  Appreciate collaboration in her care please let PCP know if there is anything that we can do on her in.

## 2019-07-12 NOTE — Assessment & Plan Note (Signed)
Will start Xyzal today.  I think that she might benefit from being on something daily outside of Singulair for antihistamine reduction.  Is on Singulair reports taking that regularly.  Has had allergy injections as well.  And has used Zyrtec in the past as needed.  Discussion today for her to be on something daily she is receptive and open to this.  Reviewed side effects, risks and benefits of medication.   Patient acknowledged agreement and understanding of the plan.

## 2019-07-13 ENCOUNTER — Ambulatory Visit: Payer: Medicaid Other | Admitting: Family Medicine

## 2019-07-29 ENCOUNTER — Encounter: Payer: Self-pay | Admitting: Family Medicine

## 2019-09-26 ENCOUNTER — Encounter: Payer: Self-pay | Admitting: Adult Health

## 2019-09-26 ENCOUNTER — Ambulatory Visit (INDEPENDENT_AMBULATORY_CARE_PROVIDER_SITE_OTHER): Payer: BLUE CROSS/BLUE SHIELD | Admitting: Adult Health

## 2019-09-26 ENCOUNTER — Other Ambulatory Visit: Payer: Self-pay

## 2019-09-26 VITALS — BP 112/74 | HR 73 | Ht 63.5 in | Wt 195.0 lb

## 2019-09-26 DIAGNOSIS — Z30011 Encounter for initial prescription of contraceptive pills: Secondary | ICD-10-CM

## 2019-09-26 DIAGNOSIS — G43109 Migraine with aura, not intractable, without status migrainosus: Secondary | ICD-10-CM

## 2019-09-26 DIAGNOSIS — Z3202 Encounter for pregnancy test, result negative: Secondary | ICD-10-CM | POA: Diagnosis not present

## 2019-09-26 LAB — POCT URINE PREGNANCY: Preg Test, Ur: NEGATIVE

## 2019-09-26 MED ORDER — NORETHINDRONE 0.35 MG PO TABS: 1.0000 | ORAL_TABLET | Freq: Every day | ORAL | 11 refills | Status: DC

## 2019-09-26 NOTE — Progress Notes (Signed)
Patient ID: Pamela Wells, female   DOB: 10-12-2000, 19 y.o.   MRN: [id number] History of Present Illness: Pamela Wells is a 19 year old black female,single,G0P0, in to discuss birth control, she has migraines with aura and is on meds. PCP is Tereasa Coop NP.   Current Medications, Allergies, Past Medical History, Past Surgical History, Family History and Social History were reviewed in [employer] record.     Review of Systems:  Patient denies any hearing loss, fatigue, blurred vision, shortness of breath, chest pain, abdominal pain, problems with bowel movements, urination, or intercourse. No joint pain or mood swings. Has migraines with aura  Skipped a period last month.   Physical Exam:BP 112/74 (BP Location: Left Arm, Patient Position: Sitting, Cuff Size: Normal)   Pulse 73   Ht 5' 3.5" (1.613 m)   Wt 195 lb (88.5 kg)   LMP 09/23/2019 (Exact Date)   BMI 34.00 kg/m UPT is negative. General:  Well developed, well nourished, no acute distress Skin:  Warm and dry Neck:  Midline trachea, normal thyroid, good ROM, no lymphadenopathy Lungs; Clear to auscultation bilaterally Cardiovascular: Regular rate and rhythm Psych:  No mood changes, alert and cooperative,seems happy Fall risk is low PHQ 2 score is 0.   Impression and Plan:  1. Pregnancy examination or test, negative result  2. Encounter for initial prescription of contraceptive pills Discussed POP,depo,IUD and nexplanon as progesterone only options,risks and benefits,  and she wants to try  POP Will Rx Micronor, start today and use condoms Meds ordered this encounter  Medications  . norethindrone (MICRONOR) 0.35 MG tablet    Sig: Take 1 tablet (0.35 mg total) by mouth daily.    Dispense:  1 Package    Refill:  11    Order Specific Question:   Supervising Provider    Answer:   Duane Lope H [[id number]]  Follow up in 3 months for ROS.  Pap at 20  Review handout on Micronor and on birth control options     3. Migraine with aura and without status migrainosus, not intractable

## 2019-09-26 NOTE — Patient Instructions (Signed)
Norethindrone tablets (contraception) What is this medicine? NORETHINDRONE (nor eth IN drone) is an oral contraceptive. The product contains a female hormone known as a progestin. It is used to prevent pregnancy. This medicine may be used for other purposes; ask your health care provider or pharmacist if you have questions. COMMON BRAND NAME(S): Camila, Deblitane 28-Day, Errin, Heather, Jencycla, Jolivette, Lyza, Nor-QD, Nora-BE, Norlyroc, Ortho Micronor, Sharobel 28-Day What should I tell my health care provider before I take this medicine? They need to know if you have any of these conditions:  blood vessel disease or blood clots  breast, cervical, or vaginal cancer  diabetes  heart disease  kidney disease  liver disease  mental depression  migraine  seizures  stroke  vaginal bleeding  an unusual or allergic reaction to norethindrone, other medicines, foods, dyes, or preservatives  pregnant or trying to get pregnant  breast-feeding How should I use this medicine? Take this medicine by mouth with a glass of water. You may take it with or without food. Follow the directions on the prescription label. Take this medicine at the same time each day and in the order directed on the package. Do not take your medicine more often than directed. Contact your pediatrician regarding the use of this medicine in children. Special care may be needed. This medicine has been used in female children who have started having menstrual periods. A patient package insert for the product will be given with each prescription and refill. Read this sheet carefully each time. The sheet may change frequently. Overdosage: If you think you have taken too much of this medicine contact a poison control center or emergency room at once. NOTE: This medicine is only for you. Do not share this medicine with others. What if I miss a dose? Try not to miss a dose. Every time you miss a dose or take a dose late  your chance of pregnancy increases. When 1 pill is missed (even if only 3 hours late), take the missed pill as soon as possible and continue taking a pill each day at the regular time (use a back up method of birth control for the next 48 hours). If more than 1 dose is missed, use an additional birth control method for the rest of your pill pack until menses occurs. Contact your health care professional if more than 1 dose has been missed. What may interact with this medicine? Do not take this medicine with any of the following medications:  amprenavir or fosamprenavir  bosentan This medicine may also interact with the following medications:  antibiotics or medicines for infections, especially rifampin, rifabutin, rifapentine, and griseofulvin, and possibly penicillins or tetracyclines  aprepitant  barbiturate medicines, such as phenobarbital  carbamazepine  felbamate  modafinil  oxcarbazepine  phenytoin  ritonavir or other medicines for HIV infection or AIDS  St. John's wort  topiramate This list may not describe all possible interactions. Give your health care provider a list of all the medicines, herbs, non-prescription drugs, or dietary supplements you use. Also tell them if you smoke, drink alcohol, or use illegal drugs. Some items may interact with your medicine. What should I watch for while using this medicine? Visit your doctor or health care professional for regular checks on your progress. You will need a regular breast and pelvic exam and Pap smear while on this medicine. Use an additional method of birth control during the first cycle that you take these tablets. If you have any reason to think you   are pregnant, stop taking this medicine right away and contact your doctor or health care professional. If you are taking this medicine for hormone related problems, it may take several cycles of use to see improvement in your condition. This medicine does not protect you  against HIV infection (AIDS) or any other sexually transmitted diseases. What side effects may I notice from receiving this medicine? Side effects that you should report to your doctor or health care professional as soon as possible:  breast tenderness or discharge  pain in the abdomen, chest, groin or leg  severe headache  skin rash, itching, or hives  sudden shortness of breath  unusually weak or tired  vision or speech problems  yellowing of skin or eyes Side effects that usually do not require medical attention (report to your doctor or health care professional if they continue or are bothersome):  changes in sexual desire  change in menstrual flow  facial hair growth  fluid retention and swelling  headache  irritability  nausea  weight gain or loss This list may not describe all possible side effects. Call your doctor for medical advice about side effects. You may report side effects to FDA at 1-800-FDA-1088. Where should I keep my medicine? Keep out of the reach of children. Store at room temperature between 15 and 30 degrees C (59 and 86 degrees F). Throw away any unused medicine after the expiration date. NOTE: This sheet is a summary. It may not cover all possible information. If you have questions about this medicine, talk to your doctor, pharmacist, or health care provider.  2020 Elsevier/Gold Standard (2012-03-12 16:41:35)  

## 2019-10-06 ENCOUNTER — Telehealth: Payer: Self-pay | Admitting: Adult Health

## 2019-10-06 NOTE — Telephone Encounter (Signed)
Patient called stating that she was on a medication that [facility] prescribed but she stopped taking it because she states she got a reaction from it, pt states that she just had a period and now she is on it again. Patient would like to know if that is normal. Please contact pt

## 2019-10-06 NOTE — Telephone Encounter (Signed)
Pt states she was taking a birth control, progesterone only pill. Pt was having headaches, felt tired and weak. Pt stopped the pill 2 days ago. Yesterday, she noticed blood. Pt had period 3/19 and just finished it on 3/25. Pt is not breastfeeding. Pt wants to come in and discuss birth control options. Call transferred to [facility] for appt. JSY

## 2019-10-13 ENCOUNTER — Ambulatory Visit: Payer: BLUE CROSS/BLUE SHIELD | Admitting: Adult Health

## 2019-10-25 ENCOUNTER — Ambulatory Visit: Payer: BLUE CROSS/BLUE SHIELD | Admitting: Adult Health

## 2019-11-03 ENCOUNTER — Ambulatory Visit (INDEPENDENT_AMBULATORY_CARE_PROVIDER_SITE_OTHER): Payer: Medicaid Other | Admitting: Family Medicine

## 2019-11-03 ENCOUNTER — Encounter: Payer: Self-pay | Admitting: Family Medicine

## 2019-11-03 ENCOUNTER — Other Ambulatory Visit: Payer: Self-pay

## 2019-11-03 VITALS — BP 106/74 | HR 79 | Temp 98.0°F | Ht 63.5 in | Wt 194.0 lb

## 2019-11-03 DIAGNOSIS — G43109 Migraine with aura, not intractable, without status migrainosus: Secondary | ICD-10-CM | POA: Diagnosis not present

## 2019-11-03 MED ORDER — TOPIRAMATE 100 MG PO TABS: 100.0000 mg | ORAL_TABLET | Freq: Two times a day (BID) | ORAL | 3 refills | Status: DC

## 2019-11-03 MED ORDER — AMITRIPTYLINE HCL 10 MG PO TABS: 10.0000 mg | ORAL_TABLET | Freq: Every day | ORAL | 3 refills | Status: DC

## 2019-11-03 MED ORDER — RIZATRIPTAN BENZOATE 10 MG PO TBDP: 10.0000 mg | ORAL_TABLET | ORAL | 11 refills | Status: DC | PRN

## 2019-11-03 NOTE — Patient Instructions (Addendum)
We will continue topiramate 50mg  daily, amitriptyline 10mg  daily and rizatriptan as needed.   Stay well hydrated.   Avoid pregnancy while taking migraines medications. Call me with any concerns and we will wean medications if needed. Consider nexplanon for birth control.   Follow up in 1 year    Migraine Headache A migraine headache is a very strong throbbing pain on one side or both sides of your head. This type of headache can also cause other symptoms. It can last from 4 hours to 3 days. Talk with your doctor about what things may bring on (trigger) this condition. What are the causes? The exact cause of this condition is not known. This condition may be triggered or caused by:  Drinking alcohol.  Smoking.  Taking medicines, such as: ? Medicine used to treat chest pain (nitroglycerin). ? Birth control pills. ? Estrogen. ? Some blood pressure medicines.  Eating or drinking certain products.  Doing physical activity. Other things that may trigger a migraine headache include:  Having a menstrual period.  Pregnancy.  Hunger.  Stress.  Not getting enough sleep or getting too much sleep.  Weather changes.  Tiredness (fatigue). What increases the risk?  Being 53-36 years old.  Being female.  Having a family history of migraine headaches.  Being Caucasian.  Having depression or anxiety.  Being very overweight. What are the signs or symptoms?  A throbbing pain. This pain may: ? Happen in any area of the head, such as on one side or both sides. ? Make it hard to do daily activities. ? Get worse with physical activity. ? Get worse around bright lights or loud noises.  Other symptoms may include: ? Feeling sick to your stomach (nauseous). ? Vomiting. ? Dizziness. ? Being sensitive to bright lights, loud noises, or smells.  Before you get a migraine headache, you may get warning signs (an aura). An aura may include: ? Seeing flashing lights or having  blind spots. ? Seeing bright spots, halos, or zigzag lines. ? Having tunnel vision or blurred vision. ? Having numbness or a tingling feeling. ? Having trouble talking. ? Having weak muscles.  Some people have symptoms after a migraine headache (postdromal phase), such as: ? Tiredness. ? Trouble thinking (concentrating). How is this treated?  Taking medicines that: ? Relieve pain. ? Relieve the feeling of being sick to your stomach. ? Prevent migraine headaches.  Treatment may also include: ? Having acupuncture. ? Avoiding foods that bring on migraine headaches. ? Learning ways to control your body functions (biofeedback). ? Therapy to help you know and deal with negative thoughts (cognitive behavioral therapy). Follow these instructions at home: Medicines  Take over-the-counter and prescription medicines only as told by your doctor.  Ask your doctor if the medicine prescribed to you: ? Requires you to avoid driving or using heavy machinery. ? Can cause trouble pooping (constipation). You may need to take these steps to prevent or treat trouble pooping:  Drink enough fluid to keep your pee (urine) pale yellow.  Take over-the-counter or prescription medicines.  Eat foods that are high in fiber. These include beans, whole grains, and fresh fruits and vegetables.  Limit foods that are high in fat and sugar. These include fried or sweet foods. Lifestyle  Do not drink alcohol.  Do not use any products that contain nicotine or tobacco, such as cigarettes, e-cigarettes, and chewing tobacco. If you need help quitting, ask your doctor.  Get at least 8 hours of sleep every night.  Limit and deal with stress. General instructions      Keep a journal to find out what may bring on your migraine headaches. For example, write down: ? What you eat and drink. ? How much sleep you get. ? Any change in what you eat or drink. ? Any change in your medicines.  If you have a  migraine headache: ? Avoid things that make your symptoms worse, such as bright lights. ? It may help to lie down in a dark, quiet room. ? Do not drive or use heavy machinery. ? Ask your doctor what activities are safe for you.  Keep all follow-up visits as told by your doctor. This is important. Contact a doctor if:  You get a migraine headache that is different or worse than others you have had.  You have more than 15 headache days in one month. Get help right away if:  Your migraine headache gets very bad.  Your migraine headache lasts longer than 72 hours.  You have a fever.  You have a stiff neck.  You have trouble seeing.  Your muscles feel weak or like you cannot control them.  You start to lose your balance a lot.  You start to have trouble walking.  You pass out (faint).  You have a seizure. Summary  A migraine headache is a very strong throbbing pain on one side or both sides of your head. These headaches can also cause other symptoms.  This condition may be treated with medicines and changes to your lifestyle.  Keep a journal to find out what may bring on your migraine headaches.  Contact a doctor if you get a migraine headache that is different or worse than others you have had.  Contact your doctor if you have more than 15 headache days in a month. This information is not intended to replace advice given to you by your health care provider. Make sure you discuss any questions you have with your health care provider. Document Revised: 10/15/2018 Document Reviewed: 08/05/2018 Elsevier Patient Education  2020 [employer].

## 2019-11-03 NOTE — Progress Notes (Addendum)
PATIENT: Pamela Wells DOB: Apr 06, 2001  REASON FOR VISIT: follow up HISTORY FROM: patient  Chief Complaint  Patient presents with  . Follow-up    RM 2. Pt as new concerns of a major migraine     HISTORY OF PRESENT ILLNESS: Today 11/03/19 Pamela Wells is a 19 y.o. female here today for follow up for migraines. She continues topiramate 100mg  BID and amitriptyline 10mg  at bedtime. She has tolerated lower dose of amitriptyline much better. Rizatriptan helps with abortive therapy. She does report a period of about 2 months (2/14 - end of March) where she discontinued her medications due to missed periord. Menstrual cycles are abnormal due to changes of OCP. She was having difficulty with side effects and discontinued birth control. She has been back on topiramate and amitriptyline for the past 3-4 weeks. She does report having two "bad headaches" over the past two months. Last one was yesterday. She was able to abort migraine with rizatriptan. She denies new or worrisome symptoms.    HISTORY: (copied from my note on 05/05/2019)  Pamela Wells is a 19 y.o. female here today for follow up for migraines. She reports that headaches have improved significantly since starting topiramate 100mg  twice daily. She continues amitriptyline 25mg  at night. She feels that there are too many days when she wakes up feeling groggy. She does not sleep well. She is in college studying nursing and has a fluctuating sleep schedule.  She has taken rizatriptan twice since July.  This medication works very well to abort migraines.  She denies any adverse effects with medication.  She has scheduled follow-up with PCP and GYN to discuss appropriate forms of birth control.   HISTORY: (copied from Dr 12 note on 02/03/2019)  [name] a 19 y.o.femalehere as requested by the emergency room for migraines. She has a PMHx of migraines and had a workup including MRI of the brain without  etiology. She tried Melatonin 1000mg , she is not sleeping. She tried Fioricet, Topamax, she stopped the Topamax because she had to be on antibiotics and felt the combo made her feel bad, she restarted Topamax and still on it, likely 25mg  once daily (in prior notes was given Topamax 25mg  twice daily with increase to 50mg  bid but she never did that and only on 25mg  once daily now. She went to the ED recently due to severe migraines, she does get left arm and leg numbness and tingling but her whole left side was painful, she couldn't move her neck. She did NOT take her medications when the migraine started and then went to ED. Imitrex did not work. SHe has migraines with nausea, migraine on the left side, throbbing in the back and in the neck, light and sound sensitivity, ongoing for a year. No family history. She can have dry heaves and nausea. They can be moderately severe to severe. She does not have a pcp, she needs one asked her. She has an aura, dots, precedes the migraine and also get sensory changes in the left side, she has dizziness. No OTC medication.  Reviewed notes, labs and imaging from outside physicians, which showed:  tsh normal  Cb/cmp with elevated gkucose  Reviewed report 08/2018 MRI brain w/wo cont: MRI brain normal. Sinus disease.  REVIEW OF SYSTEMS: Out of a complete 14 system review of symptoms, the patient complains only of the following symptoms, headaches and all other reviewed systems are negative.  ALLERGIES: Allergies  Allergen Reactions  . Lactose Intolerance (Gi)   .  Peanut-Containing Drug Products Hives  . Sesame Oil   . Mixed Grasses   . Pollen Extract     HOME MEDICATIONS: Outpatient Medications Prior to Visit  Medication Sig Dispense Refill  . albuterol (PROVENTIL HFA;VENTOLIN HFA) 108 (90 Base) MCG/ACT inhaler Inhale 1-2 puffs into the lungs every 6 (six) hours as needed for wheezing or shortness of breath. 1 Inhaler 1  . albuterol (PROVENTIL) (2.5  MG/3ML) 0.083% nebulizer solution Take 3 mLs (2.5 mg total) by nebulization every 4 (four) hours as needed for wheezing or shortness of breath. 75 mL 1  . fluticasone furoate-vilanterol (BREO ELLIPTA) 100-25 MCG/INH AEPB Inhale 1 puff into the lungs daily. 30 each 5  . Fluticasone Propionate (XHANCE) 93 MCG/ACT EXHU Place 2 sprays into the nose 2 (two) times a day. 16 mL 5  . ibuprofen (ADVIL) 800 MG tablet Take by mouth.    . levocetirizine (XYZAL) 5 MG tablet Take 1 tablet (5 mg total) by mouth every evening. 30 tablet 3  . montelukast (SINGULAIR) 10 MG tablet Take 1 tablet (10 mg total) by mouth at bedtime. 30 tablet 5  . norethindrone (MICRONOR) 0.35 MG tablet Take 1 tablet (0.35 mg total) by mouth daily. 1 Package 11  . ondansetron (ZOFRAN-ODT) 4 MG disintegrating tablet Take 1 tablet (4 mg total) by mouth every 8 (eight) hours as needed for nausea. 30 tablet 3  . amitriptyline (ELAVIL) 10 MG tablet Take 1 tablet (10 mg total) by mouth at bedtime. 90 tablet 3  . rizatriptan (MAXALT-MLT) 10 MG disintegrating tablet Take 1 tablet (10 mg total) by mouth as needed for migraine. May repeat in 2 hours if needed 9 tablet 11  . topiramate (TOPAMAX) 100 MG tablet Take 1 tablet (100 mg total) by mouth 2 (two) times daily. 90 tablet 6   No facility-administered medications prior to visit.    PAST MEDICAL HISTORY: Past Medical History:  Diagnosis Date  . Asthma    no inhaler use in the past 2 months. 09-08-17  . Food allergy   . GERD (gastroesophageal reflux disease)   . Migraines    migraines  . Pneumonia 2018  . Post-nasal drip    pt states "sinus disease"   . Urticaria     PAST SURGICAL HISTORY: Past Surgical History:  Procedure Laterality Date  . ESOPHAGOGASTRODUODENOSCOPY N/A 09/25/2017   Procedure: ESOPHAGOGASTRODUODENOSCOPY (EGD);  Surgeon: West Bali, MD;  Location: [facility];  Service: Endoscopy;  Laterality: N/A;  9:30am  . TONGUE SURGERY     HAD FRENULUM CLIPPED     FAMILY HISTORY: Family History  Problem Relation Age of Onset  . Hypertension Father   . Diabetes Father   . Ovarian cancer Maternal Grandmother   . Stroke Maternal Grandfather   . Colon cancer Neg Hx   . Colon polyps Neg Hx     SOCIAL HISTORY: Social History   Socioeconomic History  . Marital status: Single    Spouse name: Not on file  . Number of children: 1  . Years of education: Not on file  . Highest education level: Some college, no degree  Occupational History    Comment: Psychologist, counselling, [employer]  Tobacco Use  . Smoking status: Never Smoker  . Smokeless tobacco: Never Used  Substance and Sexual Activity  . Alcohol use: Yes    Comment: occ  . Drug use: No  . Sexual activity: Yes    Birth control/protection: Condom, None  Other Topics Concern  . Not  on file  Social History Narrative   In college, [place]    Eats all food groups.   Wears seatbelt.    Drives a car.    Does not smoke.    Reports that is religious and believes in God.   Reports she is not sexually active.  Denies any tobacco, alcohol, or drug use.   Social Determinants of Health   Financial Resource Strain:   . Difficulty of Paying Living Expenses:   Food Insecurity:   . Worried About Programme researcher, broadcasting/film/video in the Last Year:   . Barista in the Last Year:   Transportation Needs:   . Freight forwarder (Medical):   Marland Kitchen Lack of Transportation (Non-Medical):   Physical Activity:   . Days of Exercise per Week:   . Minutes of Exercise per Session:   Stress:   . Feeling of Stress :   Social Connections:   . Frequency of Communication with Friends and Family:   . Frequency of Social Gatherings with Friends and Family:   . Attends Religious Services:   . Active Member of Clubs or Organizations:   . Attends Banker Meetings:   Marland Kitchen Marital Status:   Intimate Partner Violence:   . Fear of Current or Ex-Partner:   . Emotionally Abused:   Marland Kitchen Physically Abused:   . Sexually  Abused:       PHYSICAL EXAM  Vitals:   11/03/19 1501  BP: 106/74  Pulse: 79  Temp: 98 F (36.7 C)  Weight: 194 lb (88 kg)  Height: 5' 3.5" (1.613 m)   Body mass index is 33.83 kg/m.  Generalized: Well developed, in no acute distress  Cardiology: normal rate and rhythm, no murmur noted Respiratory: clear to auscultation bilaterally  Neurological examination  Mentation: Alert oriented to time, place, history taking. Follows all commands speech and language fluent Cranial nerve II-XII: Pupils were equal round reactive to light. Extraocular movements were full, visual field were full on confrontational test. Facial sensation and strength were normal. Uvula tongue midline. Head turning and shoulder shrug  were normal and symmetric. Motor: The motor testing reveals 5 over 5 strength of all 4 extremities. Good symmetric motor tone is noted throughout.  Sensory: Sensory testing is intact to soft touch on all 4 extremities. No evidence of extinction is noted.  Coordination: Cerebellar testing reveals good finger-nose-finger and heel-to-shin bilaterally.  Gait and station: Gait is normal.   DIAGNOSTIC DATA (LABS, IMAGING, TESTING) - I reviewed patient records, labs, notes, testing and imaging myself where available.  No flowsheet data found.   Lab Results  Component Value Date   WBC 6.3 01/10/2019   HGB 12.8 01/10/2019   HCT 38.4 01/10/2019   MCV 92.1 01/10/2019   PLT 226 01/10/2019      Component Value Date/Time   NA 138 01/10/2019 2306   K 3.7 01/10/2019 2306   CL 104 01/10/2019 2306   CO2 23 01/10/2019 2306   GLUCOSE 110 (H) 01/10/2019 2306   BUN 16 01/10/2019 2306   CREATININE 0.65 01/10/2019 2306   CREATININE 0.61 07/17/2017 1022   CALCIUM 9.0 01/10/2019 2306   PROT 7.4 07/17/2017 1022   AST 17 07/17/2017 1022   ALT 12 07/17/2017 1022   BILITOT 0.7 07/17/2017 1022   GFRNONAA >60 01/10/2019 2306   GFRAA >60 01/10/2019 2306   No results found for: CHOL, HDL,  LDLCALC, LDLDIRECT, TRIG, CHOLHDL Lab Results  Component Value Date   HGBA1C  5.3 02/03/2019   No results found for: VITAMINB12 Lab Results  Component Value Date   TSH 0.65 07/17/2017       ASSESSMENT AND PLAN 19 y.o. year old female  has a past medical history of Asthma, Food allergy, GERD (gastroesophageal reflux disease), Migraines, Pneumonia (2018), Post-nasal drip, and Urticaria. here with     ICD-10-CM   1. Migraine with aura and without status migrainosus, not intractable  G43.109     She is doing well. She has had two migraines following discontinuation of topiramate and amitriptyline for missed period. Since resuming medications, she has felt ok. Rizatriptan continues to be helpful for abortive therapy. We will continue current treatment plan. She will call me with any concerns of pregnancy and we will wean medications as needed. She was advised to avoid pregnancy. She will discuss nexplanon with GYN. She will follow-up with primary care as directed. She will discuss birth control options with GYN.  She is aware of potential increased risk of stroke due to history of migraines with aura.  She will follow-up with [place] in 1 year, sooner if needed.  She verbalizes understanding and agreement with this plan.   No orders of the defined types were placed in this encounter.    Meds ordered this encounter  Medications  . topiramate (TOPAMAX) 100 MG tablet    Sig: Take 1 tablet (100 mg total) by mouth 2 (two) times daily.    Dispense:  180 tablet    Refill:  3    Order Specific Question:   Supervising Provider    Answer:   Melvenia Beam [id number]  . rizatriptan (MAXALT-MLT) 10 MG disintegrating tablet    Sig: Take 1 tablet (10 mg total) by mouth as needed for migraine. May repeat in 2 hours if needed    Dispense:  9 tablet    Refill:  11    Order Specific Question:   Supervising Provider    Answer:   Melvenia Beam [id number]  . amitriptyline (ELAVIL) 10 MG tablet    Sig:  Take 1 tablet (10 mg total) by mouth at bedtime.    Dispense:  90 tablet    Refill:  3    Order Specific Question:   Supervising Provider    Answer:   Melvenia Beam [id number]      I spent 30 minutes with the patient. 50% of this time was spent counseling and educating patient on plan of care and medications.    Debbora Presto, FNP-C 11/03/2019, 3:43 PM [facility] [address] [place], [place] [address] [phone]  Made any corrections needed, and agree with history, physical, neuro exam,assessment and plan as stated.     Sarina Ill, MD [facility]

## 2019-11-08 ENCOUNTER — Encounter: Payer: Medicaid Other | Admitting: Adult Health

## 2019-12-20 ENCOUNTER — Encounter: Payer: Self-pay | Admitting: Family Medicine

## 2019-12-20 ENCOUNTER — Ambulatory Visit (INDEPENDENT_AMBULATORY_CARE_PROVIDER_SITE_OTHER): Payer: BLUE CROSS/BLUE SHIELD | Admitting: Family Medicine

## 2019-12-20 ENCOUNTER — Other Ambulatory Visit: Payer: Self-pay

## 2019-12-20 VITALS — BP 118/68 | HR 90 | Temp 97.6°F | Ht 63.5 in | Wt 199.1 lb

## 2019-12-20 DIAGNOSIS — R1310 Dysphagia, unspecified: Secondary | ICD-10-CM

## 2019-12-20 DIAGNOSIS — M546 Pain in thoracic spine: Secondary | ICD-10-CM

## 2019-12-20 DIAGNOSIS — J4540 Moderate persistent asthma, uncomplicated: Secondary | ICD-10-CM

## 2019-12-20 DIAGNOSIS — N926 Irregular menstruation, unspecified: Secondary | ICD-10-CM | POA: Diagnosis not present

## 2019-12-20 DIAGNOSIS — R1013 Epigastric pain: Secondary | ICD-10-CM

## 2019-12-20 DIAGNOSIS — Z0001 Encounter for general adult medical examination with abnormal findings: Secondary | ICD-10-CM | POA: Diagnosis not present

## 2019-12-20 LAB — POCT URINE PREGNANCY: Preg Test, Ur: NEGATIVE

## 2019-12-20 MED ORDER — MONTELUKAST SODIUM 10 MG PO TABS: 10.0000 mg | ORAL_TABLET | Freq: Every day | ORAL | 3 refills | Status: DC

## 2019-12-20 MED ORDER — OMEPRAZOLE 20 MG PO CPDR: 20.0000 mg | DELAYED_RELEASE_CAPSULE | Freq: Every day | ORAL | 1 refills | Status: DC

## 2019-12-20 NOTE — Patient Instructions (Addendum)
I appreciate the opportunity to provide you with care for your health and wellness. Today we discussed: overall health   Follow up: 6 months or as needed  No labs or referrals today  GOOD LUCK on upcoming test.  Please continue to practice social distancing to keep you, your family, and our community safe.  If you must go out, please wear a mask and practice good handwashing.  It was a pleasure to see you and I look forward to continuing to work together on your health and well-being. Please do not hesitate to call the office if you need care or have questions about your care.  Have a wonderful day and week. With Gratitude, Tereasa Coop, DNP, AGNP-BC

## 2019-12-20 NOTE — Progress Notes (Signed)
Health Maintenance reviewed -   Immunization History  Administered Date(s) Administered  . Influenza,inj,Quad PF,6+ Mos 07/17/2017  . Meningococcal Mcv4o 01/20/2018  . PPD Test 12/09/2017, 01/18/2018  . Tdap 01/20/2018   Last Pap smear: N/A Last mammogram: N/A  last colonoscopy: n/a Last DEXA: n/a Dentist: Regularly Ophtho: Not needed Exercise: Not as much as she should per her. Smoker: No Alcohol Use: None  Other doctors caring for patient include:  Patient Care Team: Freddy Finner, NP as PCP - General (Family Medicine) West Bali, MD (Inactive) as Consulting Physician (Gastroenterology)  End of Life Discussion:  Patient does not have a living will and medical power of attorney  Subjective:   HPI  Pamela Wells is a 19 y.o. female who presents for annual wellness visit and follow-up on chronic medical conditions.  She has the following concerns: Reports that her cycle is late.  She has had a lot of stress.  She thinks is just related to the extra stress.  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  However she reports she has had unprotected sex.  Last cycle was the beginning of May she please make a fourth. She reports she is not sleeping a okay but she is getting there.  Appetite is decent but reduced secondary to stress.  She has a lot of stress dealing with living with her mother here in town.  Was previously living with her father neighbor.  However there is no room for up there so she is trying to stay adherent along with her mother better.  Review Of Systems  Review of Systems  Constitutional: Negative.   HENT: Negative.   Eyes: Negative.   Respiratory: Negative.   Cardiovascular: Negative.   Gastrointestinal: Negative.   Endocrine: Negative.   Genitourinary: Negative.   Musculoskeletal: Negative.   Skin: Negative.   Allergic/Immunologic: Negative.   Neurological: Negative.   Hematological: Negative.   Psychiatric/Behavioral:  Negative.   All other systems reviewed and are negative.   Objective:   PHYSICAL EXAM:  BP 118/68 (BP Location: Left Arm, Patient Position: Sitting, Cuff Size: Normal)   Pulse 90   Temp 97.6 F (36.4 C) (Temporal)   Ht 5' 3.5" (1.613 m)   Wt 199 lb 1.6 oz (90.3 kg)   LMP 11/08/2019 (Approximate)   SpO2 99%   BMI 34.72 kg/m   Physical Exam Vitals and nursing note reviewed.  Constitutional:      Appearance: Normal appearance. She is well-developed, well-groomed and overweight.  HENT:     Head: Normocephalic.     Right Ear: Hearing, tympanic membrane, ear canal and external ear normal.     Left Ear: Hearing, tympanic membrane, ear canal and external ear normal.     Ears:     Weber exam findings: does not lateralize.    Right Rinne: AC > BC.    Left Rinne: AC > BC.    Nose: Nose normal.     Mouth/Throat:     Lips: Pink.     Mouth: Mucous membranes are moist.     Pharynx: Oropharynx is clear. Uvula midline.  Eyes:     General: Lids are normal.     Extraocular Movements: Extraocular movements intact.     Conjunctiva/sclera: Conjunctivae normal.     Pupils: Pupils are equal, round, and reactive to light.  Neck:     Thyroid: No thyroid mass, thyromegaly or thyroid tenderness.     Trachea: Trachea normal.  Cardiovascular:     Rate and Rhythm: Normal rate and regular rhythm.     Pulses: Normal pulses.          Radial pulses are 2+ on the right side and 2+ on the left side.       Dorsalis pedis pulses are 2+ on the right side and 2+ on the left side.     Heart sounds: Normal heart sounds.  Pulmonary:     Effort: Pulmonary effort is normal.     Breath sounds: Normal breath sounds and air entry.  Abdominal:     General: Abdomen is flat. Bowel sounds are normal.     Palpations: Abdomen is soft.     Tenderness: There is no abdominal tenderness. There is no right CVA tenderness.  Musculoskeletal:        General: Normal range of motion.     Cervical back: Normal range of  motion and neck supple.     Right lower leg: No edema.     Left lower leg: No edema.     Comments: Moves all extremities, range of motion is full and intact.  Lymphadenopathy:     Cervical: No cervical adenopathy.  Skin:    General: Skin is warm and dry.     Capillary Refill: Capillary refill takes less than 2 seconds.  Neurological:     General: No focal deficit present.     Mental Status: She is alert and oriented to person, place, and time. Mental status is at baseline.     Cranial Nerves: Cranial nerves are intact.     Sensory: Sensation is intact.     Motor: Motor function is intact.     Coordination: Coordination is intact.     Gait: Gait is intact.     Deep Tendon Reflexes: Reflexes are normal and symmetric.  Psychiatric:        Attention and Perception: Attention and perception normal.        Mood and Affect: Mood and affect normal.        Speech: Speech normal.        Behavior: Behavior normal. Behavior is cooperative.        Thought Content: Thought content normal.        Cognition and Memory: Memory normal.        Judgment: Judgment normal.      Depression Screening  Depression screen [facility] 2/9 12/20/2019 09/26/2019 07/12/2019 12/09/2017 07/17/2017  Decreased Interest 0 0 0 0 0  Down, Depressed, Hopeless 0 0 0 0 0  PHQ - 2 Score 0 0 0 0 0  Altered sleeping 0 - - - 0  Tired, decreased energy 0 - - - 0  Change in appetite 0 - - - 0  Feeling bad or failure about yourself  1 - - - 0  Trouble concentrating 1 - - - 0  Moving slowly or fidgety/restless 0 - - - 0  Suicidal thoughts 0 - - - 0  PHQ-9 Score 2 - - - 0  Difficult doing work/chores Not difficult at all - - - -     Falls  Fall Risk  12/20/2019 09/26/2019 07/12/2019 12/09/2017  Falls in the past year? 0 0 0 No  Number falls in past yr: 0 - 0 -  Injury with Fall? 0 - 0 -  Risk for fall due to : No Fall Risks - - -  Follow up Falls evaluation completed - - -    Assessment &  Plan:   1. Annual visit for general  adult medical examination with abnormal findings   2. Moderate persistent asthma without complication   3. Missed period   4. Dysphagia, unspecified type   5. Dyspepsia   6. Acute midline thoracic back pain     Tests ordered Orders Placed This Encounter  Procedures  . Ambulatory referral to Chiropractic  . POCT urine pregnancy     Plan: Please see assessment and plan per problem list above.   Meds ordered this encounter  Medications  . montelukast (SINGULAIR) 10 MG tablet    Sig: Take 1 tablet (10 mg total) by mouth at bedtime.    Dispense:  90 tablet    Refill:  3    Order Specific Question:   Supervising Provider    Answer:   SIMPSON, MARGARET E [[id number]]  . omeprazole (PRILOSEC) 20 MG capsule    Sig: Take 1 capsule (20 mg total) by mouth daily.    Dispense:  90 capsule    Refill:  1    Order Specific Question:   Supervising Provider    Answer:   Syliva Overman E [[id number]]    I have personally reviewed: The patient's medical and social history Their use of alcohol, tobacco or illicit drugs Their current medications and supplements The patient's functional ability including ADLs,fall risks, home safety risks, cognitive, and hearing and visual impairment Diet and physical activities Evidence for depression or mood disorders  The patient's weight, height, BMI, and visual acuity have been recorded in the chart.  I have made referrals, counseling, and provided education to the patient based on review of the above and I have provided the patient with a written personalized care plan for preventive services.    Note: This dictation was prepared with Dragon dictation along with smaller phrase technology. Similar sounding words can be transcribed inadequately or may not be corrected upon review. Any transcriptional errors that result from this process are unintentional.      Freddy Finner, NP   12/22/2019

## 2019-12-22 ENCOUNTER — Encounter: Payer: Self-pay | Admitting: Family Medicine

## 2019-12-22 DIAGNOSIS — Z0001 Encounter for general adult medical examination with abnormal findings: Secondary | ICD-10-CM | POA: Insufficient documentation

## 2019-12-22 DIAGNOSIS — M546 Pain in thoracic spine: Secondary | ICD-10-CM | POA: Insufficient documentation

## 2019-12-22 DIAGNOSIS — N926 Irregular menstruation, unspecified: Secondary | ICD-10-CM | POA: Insufficient documentation

## 2019-12-22 NOTE — Assessment & Plan Note (Signed)
Restarting Singulair.  Overall doing okay does need to be back on this medication has rescue inhaler per her.

## 2019-12-22 NOTE — Assessment & Plan Note (Signed)
Most likely stress-induced.  Negative pregnancy test.

## 2019-12-22 NOTE — Assessment & Plan Note (Signed)
Restarted Prilosec.

## 2019-12-22 NOTE — Assessment & Plan Note (Signed)
Discussed monthly self breast exams and yearly mammograms once needed, as well as pap smear starts; at least 30 minutes of aerobic activity at least 5 days/week and weight-bearing exercise 2x/week; proper sunscreen use reviewed; healthy diet, including goals of calcium and vitamin D intake and alcohol recommendations (less than or equal to 1 drink/day) reviewed; regular seatbelt use; changing batteries in smoke detectors.  Immunization recommendations discussed.

## 2019-12-22 NOTE — Assessment & Plan Note (Signed)
High-dose ibuprofen recommended for back discomfort and pain as she does have full range of motion.

## 2019-12-27 ENCOUNTER — Ambulatory Visit: Payer: BLUE CROSS/BLUE SHIELD | Admitting: Adult Health

## 2020-03-01 ENCOUNTER — Encounter: Payer: Self-pay | Admitting: Adult Health

## 2020-03-01 ENCOUNTER — Other Ambulatory Visit (HOSPITAL_COMMUNITY)
Admission: RE | Admit: 2020-03-01 | Discharge: 2020-03-01 | Disposition: A | Discharge status: Home / Self Care | Payer: BLUE CROSS/BLUE SHIELD | Source: Ambulatory Visit | Attending: Adult Health | Admitting: Adult Health

## 2020-03-01 ENCOUNTER — Ambulatory Visit (INDEPENDENT_AMBULATORY_CARE_PROVIDER_SITE_OTHER): Payer: BLUE CROSS/BLUE SHIELD | Admitting: Adult Health

## 2020-03-01 VITALS — BP 134/79 | HR 91 | Ht 63.0 in | Wt 201.0 lb

## 2020-03-01 DIAGNOSIS — N926 Irregular menstruation, unspecified: Secondary | ICD-10-CM

## 2020-03-01 DIAGNOSIS — N898 Other specified noninflammatory disorders of vagina: Secondary | ICD-10-CM | POA: Diagnosis not present

## 2020-03-01 DIAGNOSIS — Z3202 Encounter for pregnancy test, result negative: Secondary | ICD-10-CM | POA: Diagnosis not present

## 2020-03-01 LAB — POCT URINE PREGNANCY: Preg Test, Ur: NEGATIVE

## 2020-03-01 MED ORDER — MEDROXYPROGESTERONE ACETATE 10 MG PO TABS: ORAL_TABLET | ORAL | 0 refills | Status: DC

## 2020-03-01 NOTE — Progress Notes (Signed)
°  Subjective:     Patient ID: Pamela Wells, female   DOB: 2001-03-27, 19 y.o.   MRN: [id number]  HPI Pamela Wells is a 19 year old black female,single, G0P0 in complaining of no period in 3 months.  She ha d physical with PCP in June. PCP is Tereasa Coop NP.  Review of Systems No period in 3 months Stopped Micronor after a few days,felt weird  Has had sex Has had stress in life but better now Feels cramping with nausea like on period but no bleeding Reviewed past medical,surgical, social and family history. Reviewed medications and allergies.     Objective:   Physical Exam BP 134/79 (BP Location: Left Arm, Patient Position: Sitting, Cuff Size: Normal)    Pulse 91    Ht 5\' 3"  (1.6 m)    Wt 201 lb (91.2 kg)    LMP 11/12/2019 (Exact Date)    BMI 35.61 kg/m UPT is negative. Skin warm and dry.Pelvic: external genitalia is normal in appearance no lesions, vagina: white discharge with odor,gralic,onion,fishy smell,urethra has no lesions or masses noted, cervix:smooth, uterus: normal size, shape and contour, non tender, no masses felt, adnexa: no masses or tenderness noted. Bladder is non tender and no masses felt. CV swab obtained  Examination chaperoned by 01/12/2020 LPN.     Upstream - 03/01/20 1620      Pregnancy Intention Screening   Does the patient want to become pregnant in the next year? No    Does the patient's partner want to become pregnant in the next year? No    Would the patient like to discuss contraceptive options today? No      Contraception Wrap Up   Current Method No Method - Other Reason    End Method No Method - Other Reason    Contraception Counseling Provided No             Assessment:     1. Urine pregnancy test negative  2. Missed periods Will rx provera 10 mg for 10 days to see if can get withdrawal bleed Meds ordered this encounter  Medications   medroxyPROGESTERone (PROVERA) 10 MG tablet    Sig: Take 1 daily for 10 days    Dispense:  10 tablet     Refill:  0    Order Specific Question:   Supervising Provider    Answer:   03/03/20, LUTHER H [[id number]]    3. Vaginal discharge CV swab sent  4. Vaginal odor CV swab sent    Plan:     Follow up in 3 weeks to see if period started or not

## 2020-03-05 ENCOUNTER — Encounter: Payer: Self-pay | Admitting: Adult Health

## 2020-03-05 ENCOUNTER — Telehealth: Payer: Self-pay | Admitting: Adult Health

## 2020-03-05 DIAGNOSIS — A749 Chlamydial infection, unspecified: Secondary | ICD-10-CM

## 2020-03-05 HISTORY — DX: Chlamydial infection, unspecified: A74.9

## 2020-03-05 LAB — CERVICOVAGINAL ANCILLARY ONLY
Bacterial Vaginitis (gardnerella): POSITIVE — AB
Candida Glabrata: NEGATIVE
Candida Vaginitis: NEGATIVE
Chlamydia: POSITIVE — AB
Comment: NEGATIVE
Comment: NEGATIVE
Comment: NEGATIVE
Comment: NEGATIVE
Comment: NEGATIVE
Comment: NORMAL
Neisseria Gonorrhea: NEGATIVE
Trichomonas: NEGATIVE

## 2020-03-05 IMAGING — DX CHEST - 2 VIEW
2 series · 2 of 2 positions shown · non-contrast
Comparison: 04/22/2017

CLINICAL DATA: Right-sided chest pain and cough

EXAM:
CHEST - 2 VIEW

[chest pa]
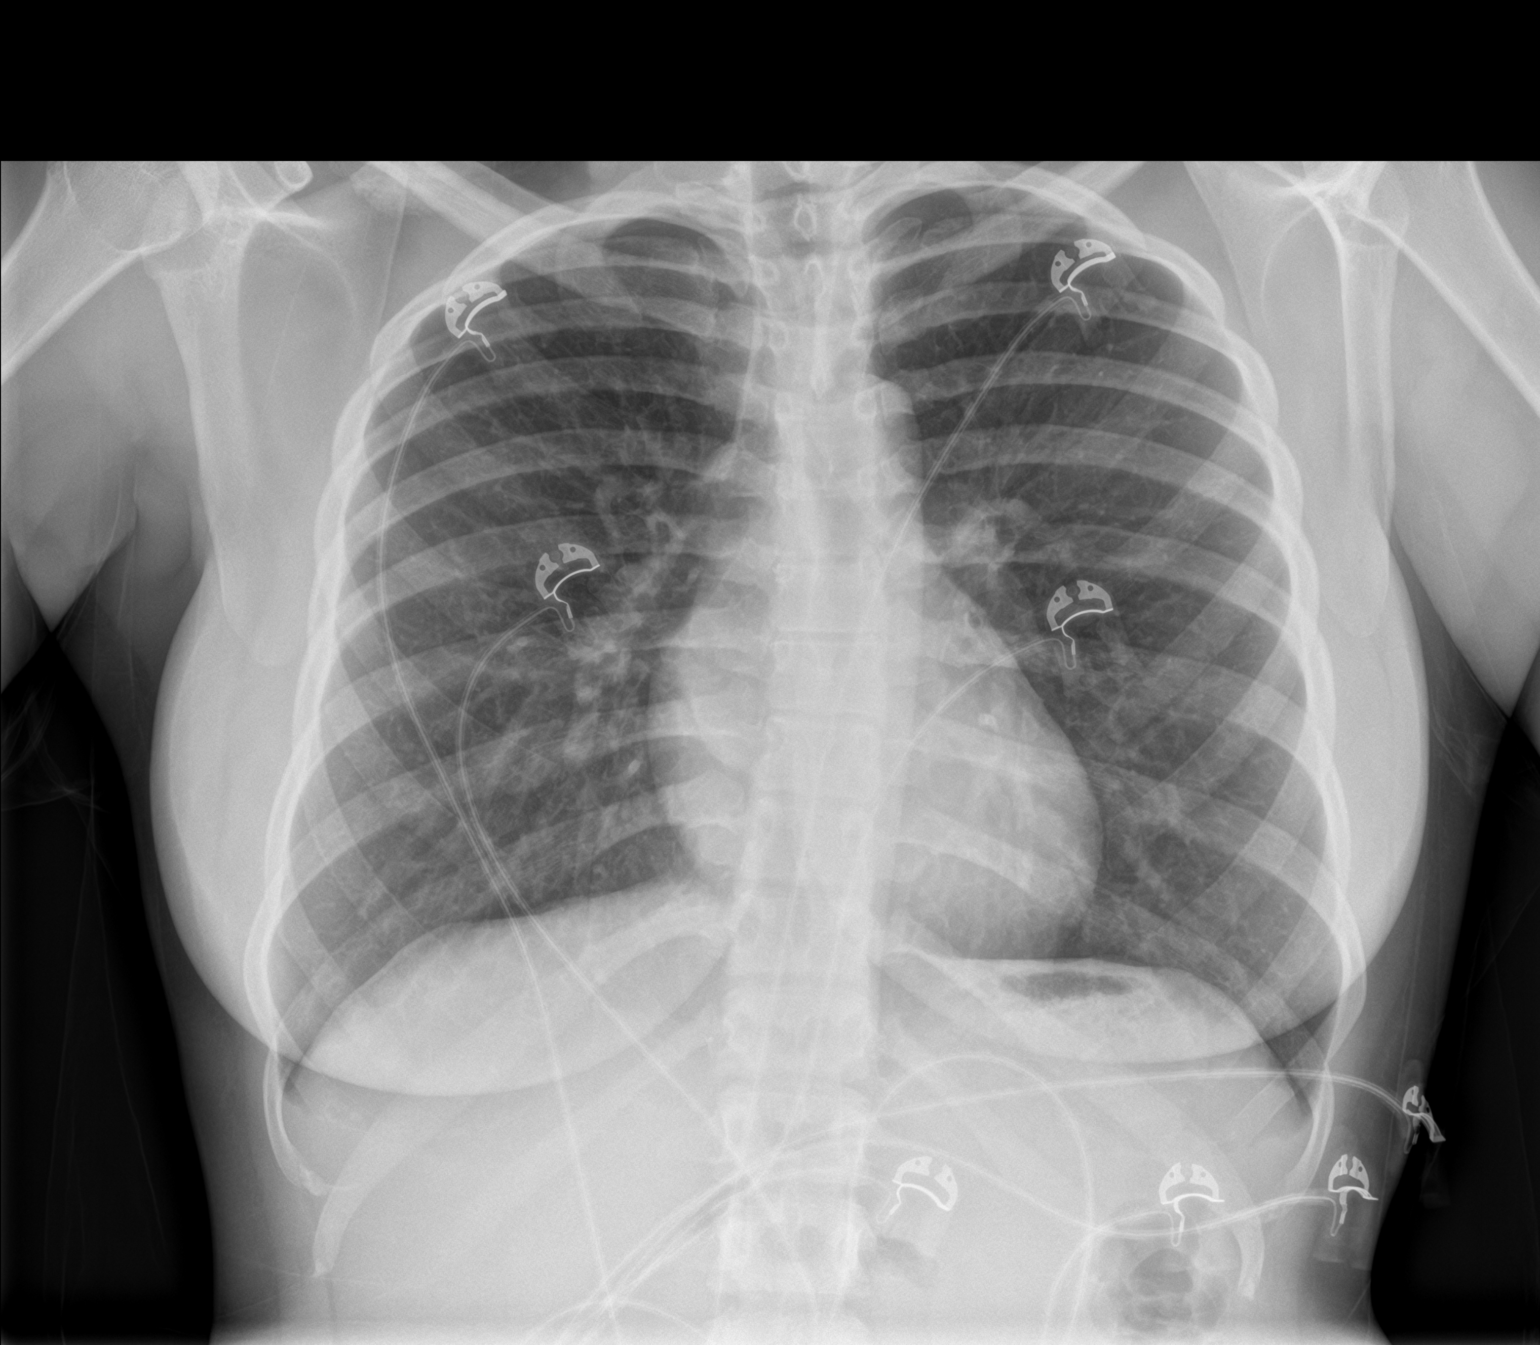

[chest lat]
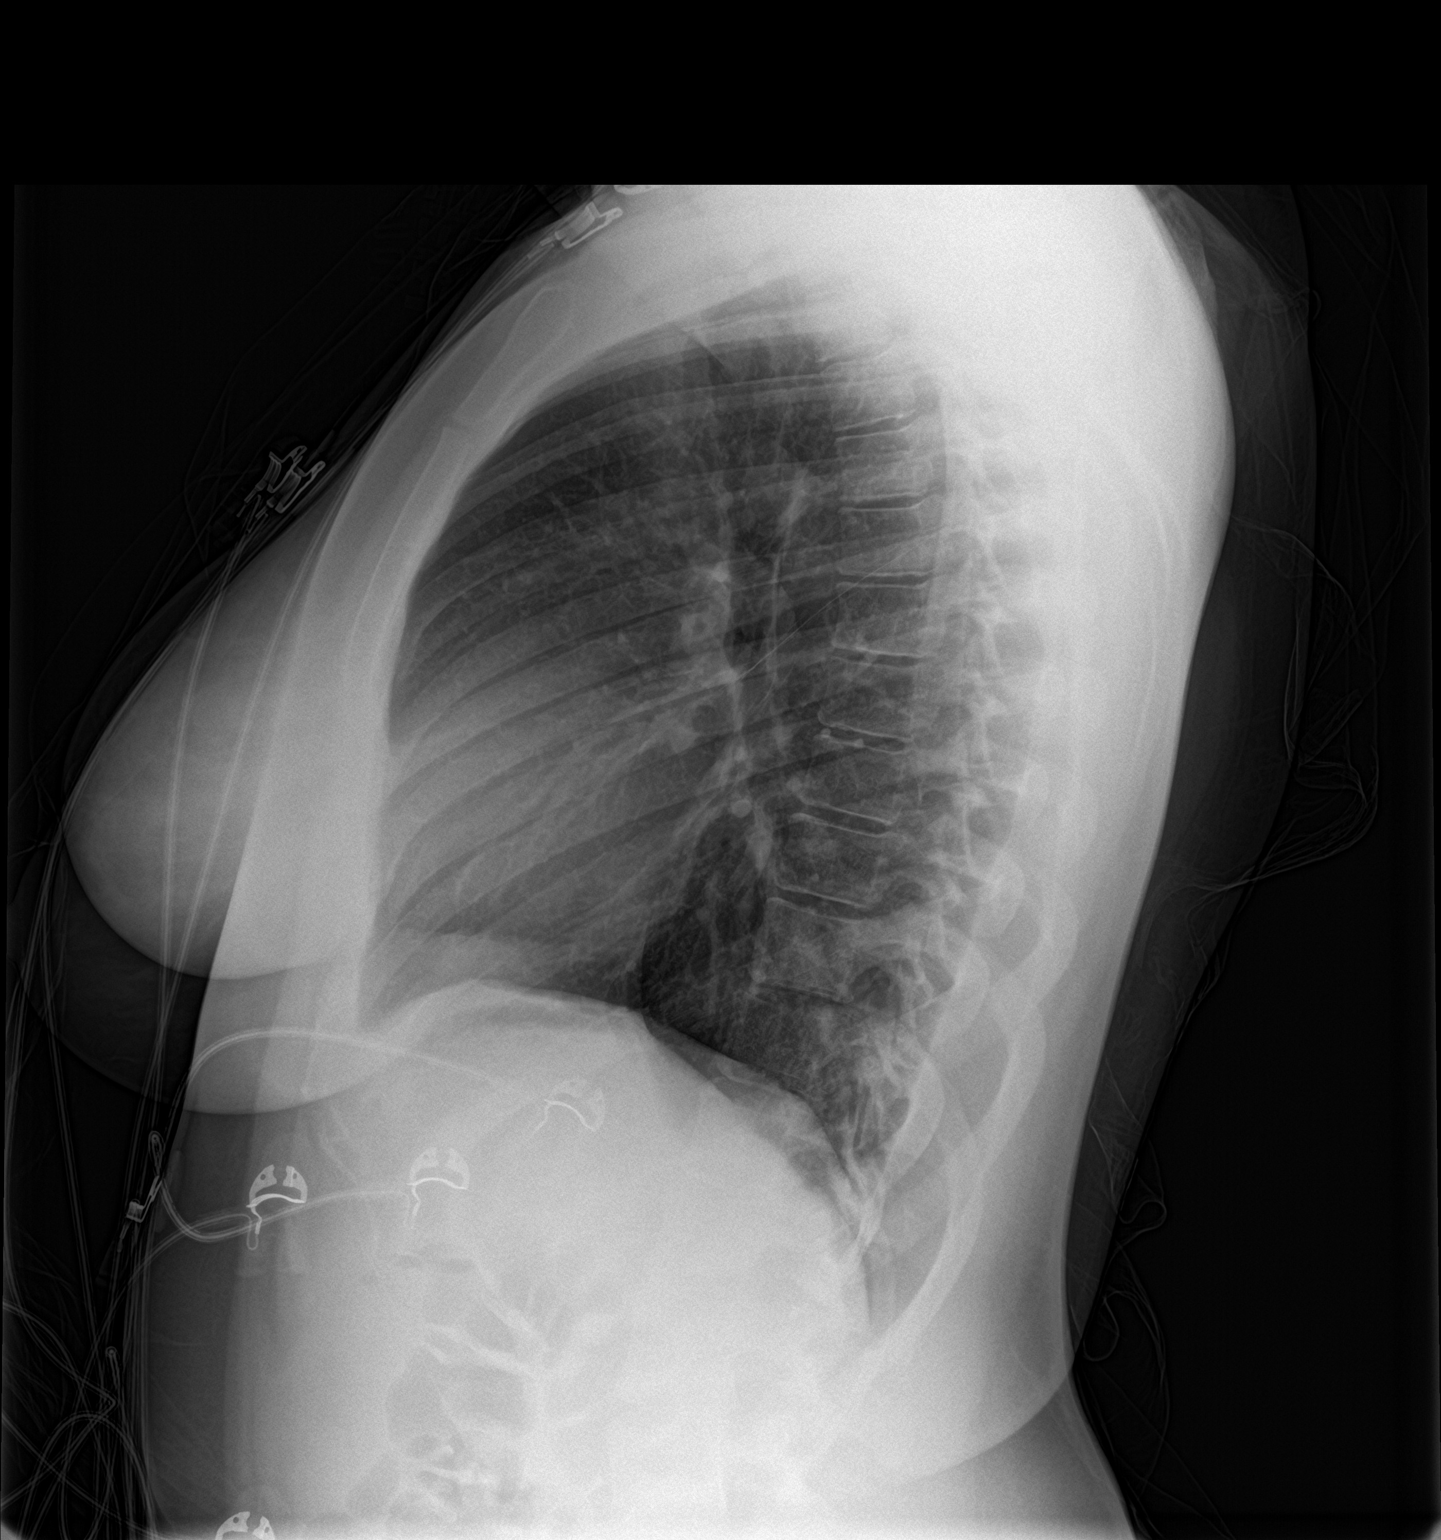

[2 of 2 positions shown; findings below may reference images not displayed]

FINDINGS: The heart size and mediastinal contours are within normal limits.
Both lungs are clear. The visualized skeletal structures are
unremarkable.
IMPRESSION: No active cardiopulmonary disease.

## 2020-03-05 MED ORDER — AZITHROMYCIN 500 MG PO TABS: ORAL_TABLET | ORAL | 0 refills | Status: DC

## 2020-03-05 MED ORDER — METRONIDAZOLE 500 MG PO TABS: 500.0000 mg | ORAL_TABLET | Freq: Two times a day (BID) | ORAL | 0 refills | Status: DC

## 2020-03-05 NOTE — Telephone Encounter (Signed)
Pt aware that vaginal swab +for BV and +chalmydia will rx azithromycin 500 mg #2 2 po now and flagyl 500 mg 1 bid x 7 days, no sex, POC at appointment in September, will treat partner Angola Morant dob 04/08/1998, azithromycin 500 mg #2 2 po now and he needs to tell any other partners to be treated, [facility] sent.

## 2020-03-13 ENCOUNTER — Ambulatory Visit (INDEPENDENT_AMBULATORY_CARE_PROVIDER_SITE_OTHER): Payer: BLUE CROSS/BLUE SHIELD

## 2020-03-13 ENCOUNTER — Encounter: Payer: Self-pay | Admitting: Family Medicine

## 2020-03-13 ENCOUNTER — Other Ambulatory Visit: Payer: Self-pay

## 2020-03-13 DIAGNOSIS — Z23 Encounter for immunization: Secondary | ICD-10-CM | POA: Diagnosis not present

## 2020-03-13 DIAGNOSIS — Z111 Encounter for screening for respiratory tuberculosis: Secondary | ICD-10-CM | POA: Diagnosis not present

## 2020-03-13 NOTE — Progress Notes (Signed)
Received flu vaccine.

## 2020-03-15 LAB — QUANTIFERON-TB GOLD PLUS
QuantiFERON Mitogen Value: >10 IU/mL
QuantiFERON Nil Value: 0 IU/mL
QuantiFERON TB1 Ag Value: 0 IU/mL
QuantiFERON TB2 Ag Value: 0 IU/mL
QuantiFERON-TB Gold Plus: NEGATIVE

## 2020-03-20 ENCOUNTER — Ambulatory Visit: Payer: BLUE CROSS/BLUE SHIELD

## 2020-03-23 ENCOUNTER — Encounter: Payer: Self-pay | Admitting: Adult Health

## 2020-03-23 ENCOUNTER — Other Ambulatory Visit (HOSPITAL_COMMUNITY)
Admission: RE | Admit: 2020-03-23 | Discharge: 2020-03-23 | Disposition: A | Discharge status: Home / Self Care | Payer: BLUE CROSS/BLUE SHIELD | Source: Ambulatory Visit | Attending: Adult Health | Admitting: Adult Health

## 2020-03-23 ENCOUNTER — Ambulatory Visit (INDEPENDENT_AMBULATORY_CARE_PROVIDER_SITE_OTHER): Payer: BLUE CROSS/BLUE SHIELD | Admitting: Adult Health

## 2020-03-23 VITALS — BP 122/83 | HR 70 | Ht 64.0 in | Wt 198.0 lb

## 2020-03-23 DIAGNOSIS — Z8619 Personal history of other infectious and parasitic diseases: Secondary | ICD-10-CM | POA: Insufficient documentation

## 2020-03-23 DIAGNOSIS — N926 Irregular menstruation, unspecified: Secondary | ICD-10-CM | POA: Diagnosis not present

## 2020-03-23 NOTE — Progress Notes (Signed)
  Subjective:     Patient ID: Pamela Wells, female   DOB: [birth year], 19 y.o.   MRN: [id number]  HPI Pamela Wells is a 19 year old black female,single, G0P0, back in follow up on taking provera for missed periods and being treated for +Chlamydia and BV. PCP is Pamela Coop NP  Review of Systems Got period 9/14 after taking provera 10 mg for 10 days Took meds for +chlamydia  Reviewed past medical,surgical, social and family history. Reviewed medications and allergies.     Objective:   Physical Exam BP 122/83 (BP Location: Left Arm, Patient Position: Sitting, Cuff Size: Normal)   Pulse 70   Ht 5\' 4"  (1.626 m)   Wt 198 lb (89.8 kg)   LMP 03/20/2020 (Exact Date)   BMI 33.99 kg/m  Skin warm and dry.Pelvic: external genitalia is normal in appearance no lesions, vagina: period blood,urethra has no lesions or masses noted, cervix:smooth, uterus: normal size, shape and contour, non tender, no masses felt, adnexa: no masses or tenderness noted. Bladder is non tender and no masses felt.CV swab obtained      Upstream - 03/23/20 1026      Pregnancy Intention Screening   Does the patient want to become pregnant in the next year? No    Does the patient's partner want to become pregnant in the next year? No    Would the patient like to discuss contraceptive options today? No      Contraception Wrap Up   Current Method No Contraceptive Precautions    End Method No Contraception Precautions    Contraception Counseling Provided No         Examination chaperoned by 03/25/20 LPN  Assessment:     1. History of chlamydia CV swab sent Use condoms  2. Irregular periods She wants to see how periods come now, declines OCs to regulate     Plan:     Follow up 07/13/20 for physical and first pap before going back to college, she is RA.

## 2020-03-26 ENCOUNTER — Other Ambulatory Visit: Payer: Self-pay | Admitting: Adult Health

## 2020-03-26 LAB — CERVICOVAGINAL ANCILLARY ONLY
Bacterial Vaginitis (gardnerella): POSITIVE — AB
Chlamydia: NEGATIVE
Comment: NEGATIVE
Comment: NEGATIVE
Comment: NEGATIVE
Comment: NORMAL
Neisseria Gonorrhea: NEGATIVE
Trichomonas: NEGATIVE

## 2020-03-26 MED ORDER — METRONIDAZOLE 500 MG PO TABS: 500.0000 mg | ORAL_TABLET | Freq: Two times a day (BID) | ORAL | 0 refills | Status: DC

## 2020-03-26 NOTE — Progress Notes (Signed)
+  BV on CV swab rx flagyl,

## 2020-04-09 ENCOUNTER — Encounter: Payer: Self-pay | Admitting: Family Medicine

## 2020-04-09 ENCOUNTER — Encounter: Payer: Self-pay | Admitting: Internal Medicine

## 2020-04-09 ENCOUNTER — Other Ambulatory Visit: Payer: Self-pay

## 2020-04-09 ENCOUNTER — Telehealth (INDEPENDENT_AMBULATORY_CARE_PROVIDER_SITE_OTHER): Payer: BLUE CROSS/BLUE SHIELD | Admitting: Internal Medicine

## 2020-04-09 VITALS — BP 122/83 | Ht 64.0 in | Wt 198.0 lb

## 2020-04-09 DIAGNOSIS — Z1339 Encounter for screening examination for other mental health and behavioral disorders: Secondary | ICD-10-CM

## 2020-04-09 NOTE — Progress Notes (Signed)
Virtual Visit via Telephone Note   This visit type was conducted due to national recommendations for restrictions regarding the COVID-19 Pandemic (e.g. social distancing) in an effort to limit this patient's exposure and mitigate transmission in our community.  Due to her co-morbid illnesses, this patient is at least at moderate risk for complications without adequate follow up.  This format is felt to be most appropriate for this patient at this time.  The patient did not have access to video technology/had technical difficulties with video requiring transitioning to audio format only (telephone).  All issues noted in this document were discussed and addressed.  No physical exam could be performed with this format.  Evaluation Performed:  Follow-up visit  Date:  04/09/2020   ID:  Pamela Wells, DOB [birth year], MRN [id number]  Patient Location: Home Provider Location: Office/Clinic  Location of Patient: Home Location of Provider: Telehealth Consent was obtain for visit to be over via telehealth. I verified that I am speaking with the correct person using two identifiers.  PCP:  Freddy Finner, NP   Chief Complaint:  Difficulty concentration  History of Present Illness:    Pamela Wells is a 19 y.o. female with past medical history of asthma and migraine has a televisit for evaluation for ADHD.  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  She feels that she tries to concentrate to the teacher, but she does not realize when she starts to think about other topics at the same time.  She mentions that when she tries to read at home, she starts thinking about other vague topics unrelated to what she is reading.  She was told by her counselor at the college that she needs to be evaluated for ADHD.  Of note, she mentions that she had similar symptoms when she was in high school, but was not evaluated at that time.  She also complains of difficulty sleeping due  to racing thoughts. She denies anhedonia, recent appetite or weight change. Patient has a h/o migraine, which has been well-controlled with Topamax and as needed Rizatriptan.  The patient does not have symptoms concerning for COVID-19 infection (fever, chills, cough, or new shortness of breath).   Past Medical, Surgical, Social History, Allergies, and Medications have been Reviewed.  Past Medical History:  Diagnosis Date  . Abdominal pain, RUQ (right upper quadrant) 09/03/2017  . Asthma    no inhaler use in the past 2 months. 09-08-17  . 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 Chlamydia 03/05/2020   Treated 8/30, POC_________  . Constipation 07/17/2017  . Dyspepsia   . Food allergy   . GERD (gastroesophageal reflux disease)   . Intractable chronic migraine without aura and without status migrainosus 02/03/2019  . Migraines    migraines  . Multiple food allergies 07/17/2017  . Pneumonia 2018  . Post-nasal drip    pt states "sinus disease"   . 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 Urticaria    Past Surgical History:  Procedure Laterality Date  . ESOPHAGOGASTRODUODENOSCOPY N/A 09/25/2017   Procedure: ESOPHAGOGASTRODUODENOSCOPY (EGD);  Surgeon: West Bali, MD;  [facility];  Service: Endoscopy;  Laterality: N/A;  9:30am  . TONGUE SURGERY     HAD FRENULUM CLIPPED     Current Meds  Medication Sig  . amitriptyline (ELAVIL) 10 MG tablet Take 1 tablet (10 mg total) by mouth at bedtime.  Marland Kitchen ibuprofen (ADVIL) 800 MG tablet Take by mouth.  . medroxyPROGESTERone (PROVERA) 10  MG tablet Take 1 daily for 10 days  . metroNIDAZOLE (FLAGYL) 500 MG tablet Take 1 tablet (500 mg total) by mouth 2 (two) times daily.  . montelukast (SINGULAIR) 10 MG tablet Take 1 tablet (10 mg total) by mouth at bedtime.  Marland Kitchen omeprazole (PRILOSEC) 20 MG capsule Take 1 capsule (20 mg total) by mouth daily.  . rizatriptan (MAXALT-MLT) 10 MG disintegrating tablet Take 1 tablet (10 mg total) by mouth as needed for  migraine. May repeat in 2 hours if needed  . topiramate (TOPAMAX) 100 MG tablet Take 1 tablet (100 mg total) by mouth 2 (two) times daily.     Allergies:   Lactose intolerance (gi), Peanut-containing drug products, Sesame oil, Mixed grasses, and Pollen extract   ROS:   Please see the history of present illness.     All other systems reviewed and are negative.   Labs/Other Tests and Data Reviewed:    Recent Labs: No results found for requested labs within last 8760 hours.   Recent Lipid Panel No results found for: CHOL, TRIG, HDL, CHOLHDL, LDLCALC, LDLDIRECT  Wt Readings from Last 3 Encounters:  04/09/20 198 lb (89.8 kg)  03/23/20 198 lb (89.8 kg)  03/01/20 201 lb (91.2 kg)     Objective:    Vital Signs:  BP 122/83   Ht 5\' 4"  (1.626 m)   Wt 198 lb (89.8 kg)   LMP 03/20/2020 (Exact Date)   BMI 33.99 kg/m    VITAL SIGNS:  reviewed  ASSESSMENT & PLAN:    Difficulty concentration in different setting, r/o ADHD Had similar symptoms since highschool Will provide Behavioral health referral No signs of depression at this time Advised lifestyle modifications to help with sleep Discussed warning signs and advised to get immediate medical attention in case of self-harm/suicidal thoughts    Time:   Today, I have spent 10 minutes with the patient with telehealth technology discussing the above problems.     Medication Adjustments/Labs and Tests Ordered: Current medicines are reviewed at length with the patient today.  Concerns regarding medicines are outlined above.   Tests Ordered: No orders of the defined types were placed in this encounter.   Medication Changes: No orders of the defined types were placed in this encounter.   Disposition:  Follow up  Signed, 03/22/2020, MD  04/09/2020 1:39 PM     06/09/2020 [facility]

## 2020-04-09 NOTE — Patient Instructions (Addendum)
You are instructed to follow-up with Behavioral health specialist as scheduled.  Meanwhile, please continue your daily activities.  Please try to maintain schedule of sleep-wake cycle.  Please maintain dark, nonnoisy environment in the bedroom.  Avoid using electronic device at bedtime.  Please continue to follow healthy diet, consisting of green vegetables and fruits.  Please perform moderate exercise at least 150 minutes/week.

## 2020-04-16 ENCOUNTER — Telehealth: Payer: Self-pay

## 2020-04-16 NOTE — Telephone Encounter (Signed)
Pt was referred for ADHD review\eval., however the Psych Dr is not taking any new patients.  Pt needs a New Referral to another [facility] Provider

## 2020-04-17 ENCOUNTER — Other Ambulatory Visit: Payer: Self-pay | Admitting: Neurology

## 2020-04-17 NOTE — Telephone Encounter (Signed)
Please place new referral for another provider for her ADHD review and eval. There are places in [place] that should be able to take her.  Thank you.

## 2020-04-18 ENCOUNTER — Other Ambulatory Visit: Payer: Self-pay

## 2020-04-18 DIAGNOSIS — Z1339 Encounter for screening examination for other mental health and behavioral disorders: Secondary | ICD-10-CM

## 2020-04-18 IMAGING — CT CT MAXILLOFACIAL WITHOUT CONTRAST
3 series · 12 of 47 positions shown, 14 images · non-contrast
Comparison: None available.

CLINICAL DATA: Initial evaluation for chronic maxillary sinusitis.

EXAM:
CT MAXILLOFACIAL WITHOUT CONTRAST
TECHNIQUE: Multidetector CT images of the paranasal sinuses were obtained using
the standard protocol without intravenous contrast.

[Series 2: standard · axial · 0.35mm/px · z∈[+76,+154]mm · 6 of 100 slices shown, 8 images]
[im 11/100  brain]
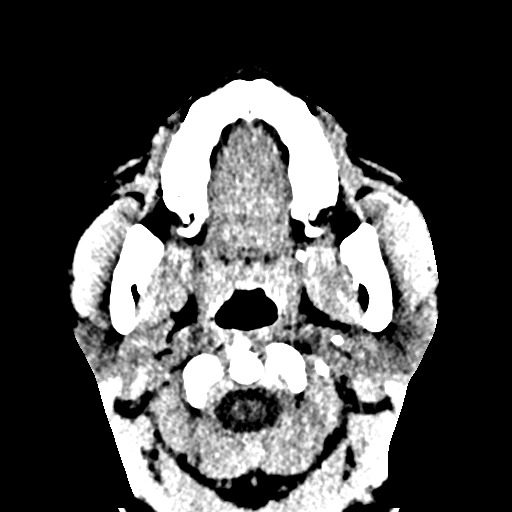
[im 11/100  bone]
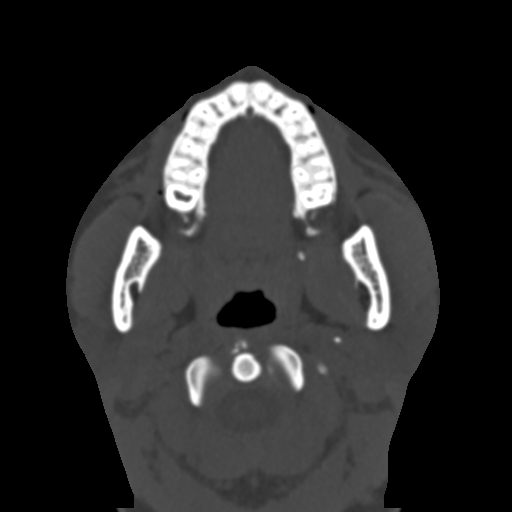
[im 28/100  bone]
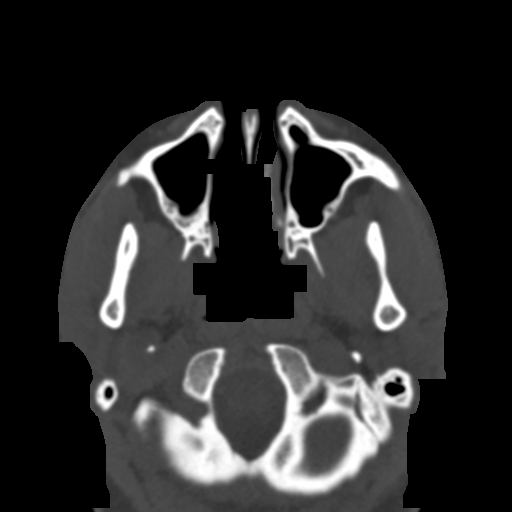
[im 41/100  bone]
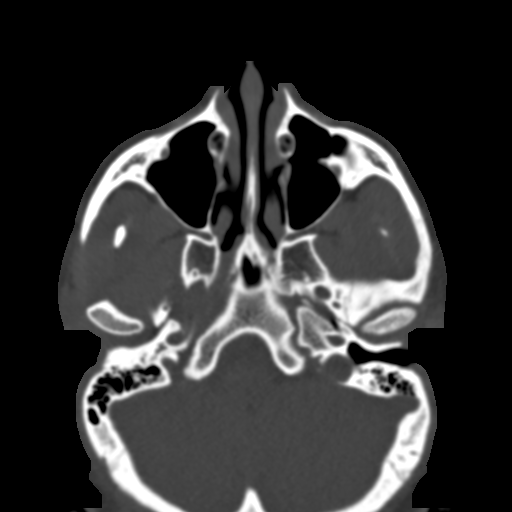
[im 59/100  bone]
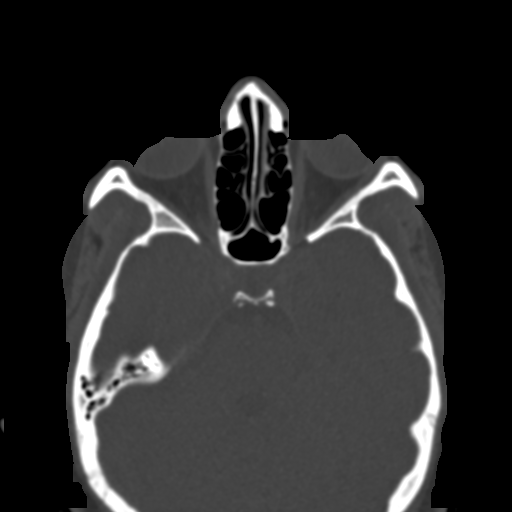
[im 76/100  brain]
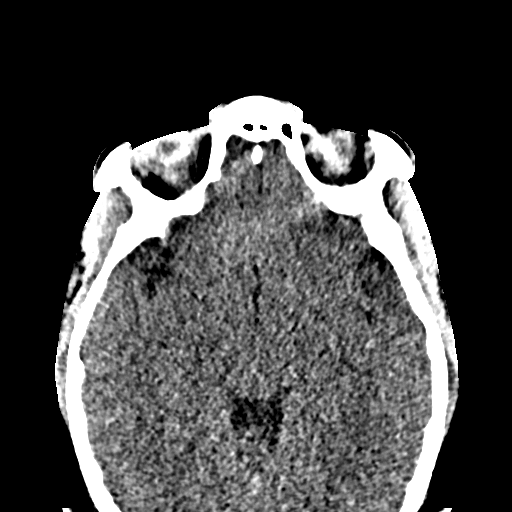
[im 76/100  bone]
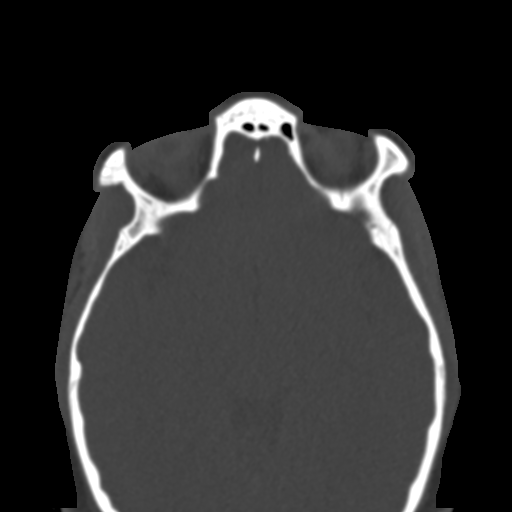
[im 89/100  bone]
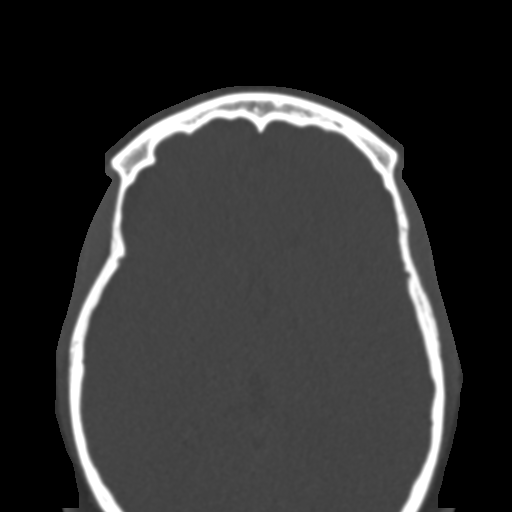

[Series 4: coronal · coronal · 0.21mm/px · 3 of 75 slices shown]
[im 25/75  bone]
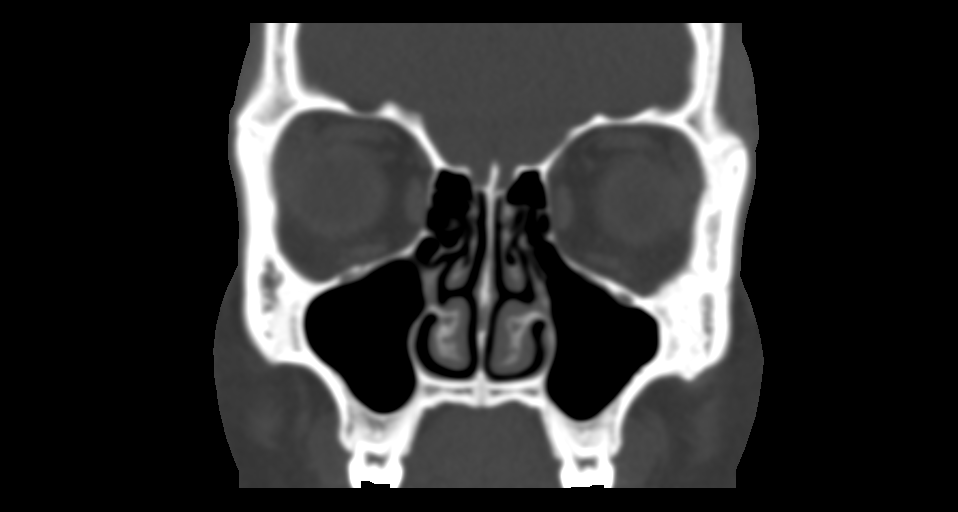
[im 33/75  bone]
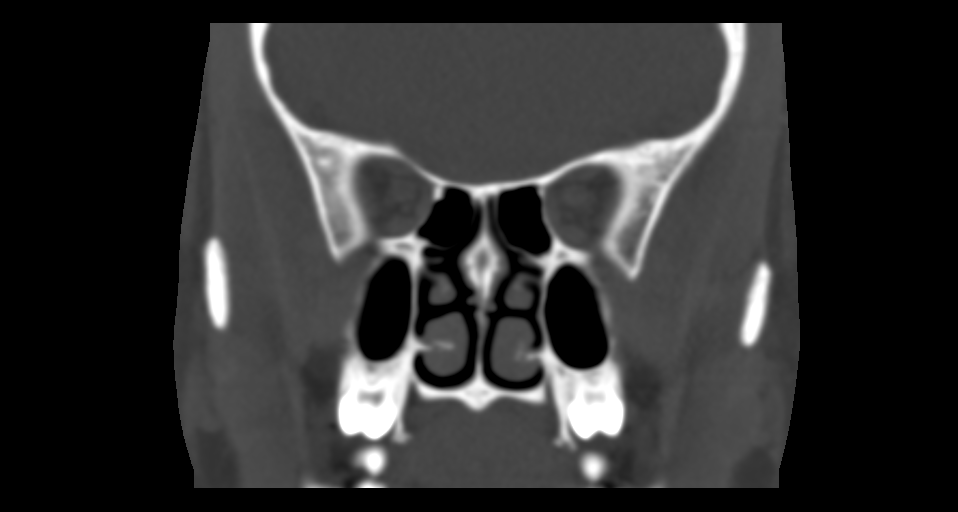
[im 42/75  bone]
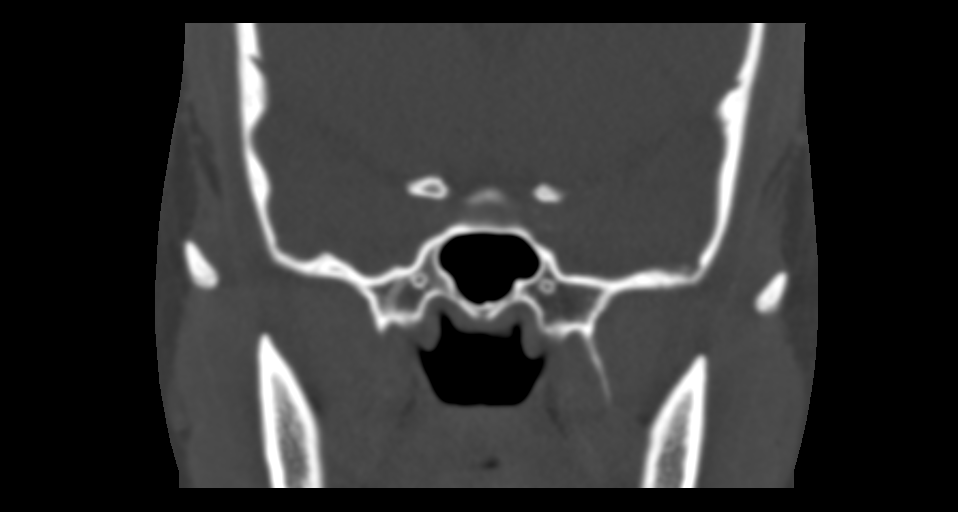

[Series 5: sagittal · sagittal · 0.21mm/px · 3 of 87 slices shown]
[im 29/87  bone]
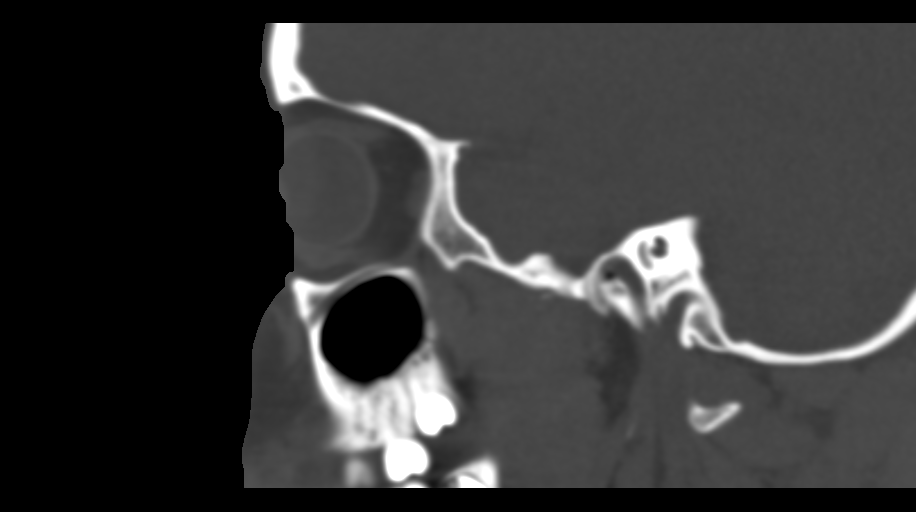
[im 44/87  bone]
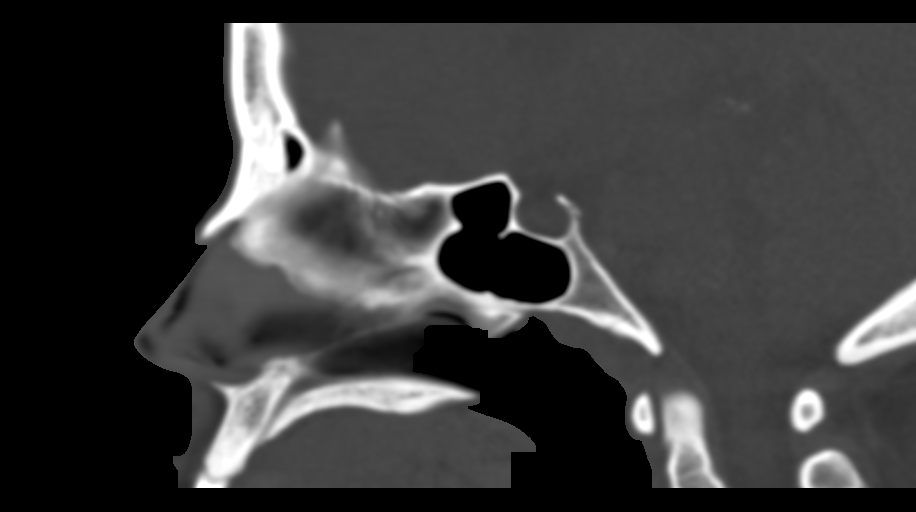
[im 58/87  bone]
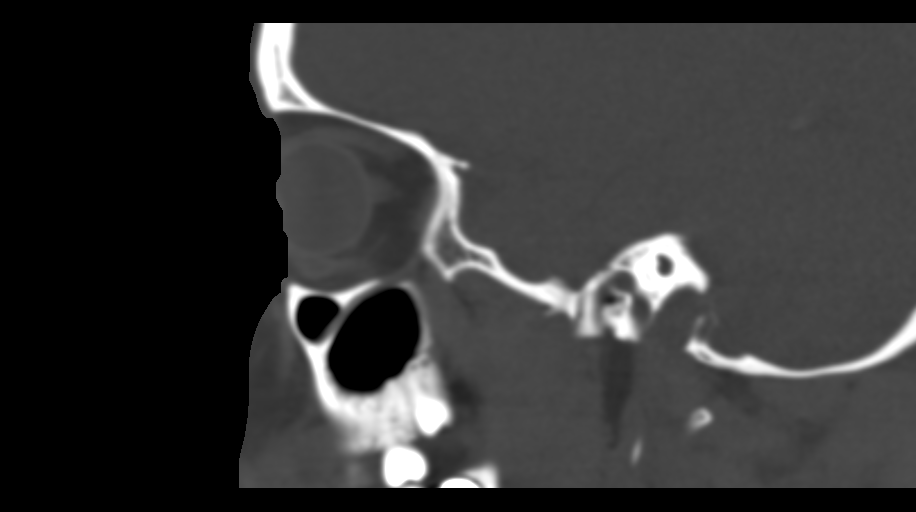

[12 of 47 positions shown; findings below may reference images not displayed]

FINDINGS: Paranasal sinuses:

Frontal: Frontal sinuses are well aerated bilaterally with wide
patency of the frontoethmoidal recesses.

Ethmoid: Mild scattered mucoperiosteal thickening within the
ethmoidal air cells bilaterally without significant sinus
opacification.

Maxillary: Mild circumferential mucoperiosteal thickening about the
maxillary sinuses bilaterally. No significant sinus opacification.

Sphenoid: Minimal mucosal thickening within the dominant left
sphenoid sinus. Right sphenoid sinus hypoplastic. Patent
sphenoethmoidal foramina.

Right ostiomeatal unit: Patent.

Left ostiomeatal unit: Patent.

Nasal passages: Pa nasal septum relatively midline and intact. No
concha bullosa. Nasal passages are patent bilaterally.

Other: Mastoid air cells and middle ear cavities are well
pneumatized and free of fluid. Visualized intracranial contents
within normal limits. Globes and orbital soft tissues within normal
limits. Visualized soft tissues of the face demonstrate no acute
finding.
IMPRESSION: 1. Mild scattered chronic mucoperiosteal thickening throughout the
ethmoidal air cells, sphenoid sinuses, and maxillary sinuses
bilaterally. No air-fluid levels to suggest acute sinusitis at this
time.
2. Patent sinus drainage pathways.

## 2020-04-18 NOTE — Telephone Encounter (Signed)
Referral sent 

## 2020-06-20 ENCOUNTER — Encounter: Payer: Self-pay | Admitting: Family Medicine

## 2020-06-20 ENCOUNTER — Telehealth (INDEPENDENT_AMBULATORY_CARE_PROVIDER_SITE_OTHER): Payer: BLUE CROSS/BLUE SHIELD | Admitting: Family Medicine

## 2020-06-20 ENCOUNTER — Other Ambulatory Visit: Payer: Self-pay

## 2020-06-20 VITALS — Ht 63.5 in | Wt 185.0 lb

## 2020-06-20 DIAGNOSIS — J4540 Moderate persistent asthma, uncomplicated: Secondary | ICD-10-CM | POA: Diagnosis not present

## 2020-06-20 DIAGNOSIS — G43109 Migraine with aura, not intractable, without status migrainosus: Secondary | ICD-10-CM | POA: Diagnosis not present

## 2020-06-20 MED ORDER — ALBUTEROL SULFATE HFA 108 (90 BASE) MCG/ACT IN AERS: 1.0000 | INHALATION_SPRAY | Freq: Four times a day (QID) | RESPIRATORY_TRACT | 2 refills | Status: DC | PRN

## 2020-06-20 NOTE — Patient Instructions (Addendum)
°  I appreciate the opportunity to provide you with care for your health and wellness. Today we discussed: asthma  Follow up: June, if ok with another provider for CPE, if no Aug for me  No labs or referrals today  So glad all is going well! Hope you continue to feel better.  Call if you need anything prior to your next appt.   Merry Christmas and Happy New Year!  Please continue to practice social distancing to keep you, your family, and our community safe.  If you must go out, please wear a mask and practice good handwashing.  It was a pleasure to see you and I look forward to continuing to work together on your health and well-being. Please do not hesitate to call the office if you need care or have questions about your care.  Have a wonderful day. With Gratitude, Tereasa Coop, DNP, AGNP-BC

## 2020-06-20 NOTE — Assessment & Plan Note (Signed)
Well controlled, follows with Neurology for this.

## 2020-06-20 NOTE — Progress Notes (Signed)
Virtual Visit via Telephone Note   This visit type was conducted due to national recommendations for restrictions regarding the COVID-19 Pandemic (e.g. social distancing) in an effort to limit this patient's exposure and mitigate transmission in our community.  Due to her co-morbid illnesses, this patient is at least at moderate risk for complications without adequate follow up.  This format is felt to be most appropriate for this patient at this time.  The patient did not have access to video technology/had technical difficulties with video requiring transitioning to audio format only (telephone).  All issues noted in this document were discussed and addressed.  No physical exam could be performed with this format.   Evaluation Performed:  Follow-up visit  Date:  06/20/2020   ID:  Pamela Wells, DOB 2001/06/06, MRN [id number]  Patient Location: Home Provider Location: Office/Clinic  Participants: Nurse for intake and work up; Patient and Provider for Visit and Wrap up  Method of visit: Telephone   Location of Patient: Home Location of Provider: Office Consent was obtain for visit over the telephone. Services rendered by provider: Visit was performed via telephone   I verified that I am speaking with the correct person using two identifiers.  PCP:  Freddy Finner, NP   Chief Complaint:  Med refills   History of Present Illness:    Pamela Wells is a 19 y.o. female with history as stated below.  Reports today for follow-up and med refill.  Reports that she recently had a cold and she went through her inhaler pretty regularly and needs a refill.  She denies having any shortness of breath or excessive coughing at this time.  Just needs a refill on the inhaler.  She reports some still nasal sinus congestion but overall feeling much better.  She denies having any chest pain, headaches or dizziness.  She denies having any other concerns to discuss today.  The patient does not  have symptoms concerning for COVID-19 infection (fever, chills, cough, or new shortness of breath).   Past Medical, Surgical, Social History, Allergies, and Medications have been Reviewed.  Past Medical History:  Diagnosis Date  . Abdominal pain, RUQ (right upper quadrant) 09/03/2017  . Asthma    no inhaler use in the past 2 months. 09-08-17  . 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 Chlamydia 03/05/2020   Treated 8/30, POC_________  . Constipation 07/17/2017  . Dyspepsia   . Food allergy   . GERD (gastroesophageal reflux disease)   . Intractable chronic migraine without aura and without status migrainosus 02/03/2019  . Menorrhagia with regular cycle 07/12/2019  . Migraines    migraines  . Multiple food allergies 07/17/2017  . Pneumonia 2018  . Post-nasal drip    pt states "sinus disease"   . 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 Urticaria    Past Surgical History:  Procedure Laterality Date  . ESOPHAGOGASTRODUODENOSCOPY N/A 09/25/2017   Procedure: ESOPHAGOGASTRODUODENOSCOPY (EGD);  Surgeon: West Bali, MD;  [facility]: [facility];  Service: Endoscopy;  Laterality: N/A;  9:30am  . TONGUE SURGERY     HAD FRENULUM CLIPPED     Current Meds  Medication Sig  . amitriptyline (ELAVIL) 10 MG tablet Take 1 tablet (10 mg total) by mouth at bedtime.  Marland Kitchen ibuprofen (ADVIL) 800 MG tablet Take by mouth.  . montelukast (SINGULAIR) 10 MG tablet Take 1 tablet (10 mg total) by mouth at bedtime.  Marland Kitchen omeprazole (PRILOSEC) 20 MG capsule Take 1 capsule (20 mg total) by  mouth daily.  . rizatriptan (MAXALT-MLT) 10 MG disintegrating tablet Take 1 tablet (10 mg total) by mouth as needed for migraine. May repeat in 2 hours if needed  . topiramate (TOPAMAX) 100 MG tablet Take 1 tablet (100 mg total) by mouth 2 (two) times daily.  . [DISCONTINUED] medroxyPROGESTERone (PROVERA) 10 MG tablet Take 1 daily for 10 days  . [DISCONTINUED] metroNIDAZOLE (FLAGYL) 500 MG tablet Take 1 tablet (500 mg total) by mouth 2 (two)  times daily.     Allergies:   Lactose intolerance (gi), Peanut-containing drug products, Sesame oil, Mixed grasses, and Pollen extract   ROS:   Please see the history of present illness.    All other systems reviewed and are negative.   Labs/Other Tests and Data Reviewed:    Recent Labs: No results found for requested labs within last 8760 hours.   Recent Lipid Panel No results found for: CHOL, TRIG, HDL, CHOLHDL, LDLCALC, LDLDIRECT  Wt Readings from Last 3 Encounters:  06/20/20 185 lb (83.9 kg)  04/09/20 198 lb (89.8 kg)  03/23/20 198 lb (89.8 kg)     Objective:    Vital Signs:  Ht 5' 3.5" (1.613 m)   Wt 185 lb (83.9 kg)   LMP 05/23/2020   BMI 32.26 kg/m    VITAL SIGNS:  reviewed GEN:  no acute distress RESPIRATORY:  no shortness of breath in conversation  PSYCH:  normal affect  ASSESSMENT & PLAN:   1. Migraine with aura and without status migrainosus, not intractable   2. Moderate persistent asthma without complication  - albuterol (VENTOLIN HFA) 108 (90 Base) MCG/ACT inhaler; Inhale 1-2 puffs into the lungs every 6 (six) hours as needed for wheezing or shortness of breath.  Dispense: 18 g; Refill: 2   Time:   Today, I have spent 5 minutes with the patient with telehealth technology discussing the above problems.     Medication Adjustments/Labs and Tests Ordered: Current medicines are reviewed at length with the patient today.  Concerns regarding medicines are outlined above.   Tests Ordered: No orders of the defined types were placed in this encounter.   Medication Changes: Meds ordered this encounter  Medications  . albuterol (VENTOLIN HFA) 108 (90 Base) MCG/ACT inhaler    Sig: Inhale 1-2 puffs into the lungs every 6 (six) hours as needed for wheezing or shortness of breath.    Dispense:  18 g    Refill:  2    Order Specific Question:   Supervising Provider    Answer:   Kerri Perches [[id number]]     Disposition:  Follow up Summer 2022 for CPE   Signed, Freddy Finner, NP  06/20/2020 2:19 PM     [facility]

## 2020-06-20 NOTE — Assessment & Plan Note (Signed)
Recent cold- needs refill of inhaler. Denies S&S worsen, reports improvement.  Refills provided.

## 2020-08-03 ENCOUNTER — Encounter: Payer: Self-pay | Admitting: Adult Health

## 2020-08-03 ENCOUNTER — Other Ambulatory Visit: Payer: Self-pay

## 2020-08-03 ENCOUNTER — Ambulatory Visit (INDEPENDENT_AMBULATORY_CARE_PROVIDER_SITE_OTHER): Payer: Medicaid Other | Admitting: Adult Health

## 2020-08-03 ENCOUNTER — Other Ambulatory Visit (HOSPITAL_COMMUNITY)
Admission: RE | Admit: 2020-08-03 | Discharge: 2020-08-03 | Disposition: A | Discharge status: Home / Self Care | Payer: Medicaid Other | Source: Ambulatory Visit | Attending: Adult Health | Admitting: Adult Health

## 2020-08-03 VITALS — BP 115/75 | HR 68 | Ht 64.0 in | Wt 209.5 lb

## 2020-08-03 DIAGNOSIS — Z3202 Encounter for pregnancy test, result negative: Secondary | ICD-10-CM

## 2020-08-03 DIAGNOSIS — Z30011 Encounter for initial prescription of contraceptive pills: Secondary | ICD-10-CM

## 2020-08-03 DIAGNOSIS — N926 Irregular menstruation, unspecified: Secondary | ICD-10-CM | POA: Diagnosis not present

## 2020-08-03 DIAGNOSIS — Z01419 Encounter for gynecological examination (general) (routine) without abnormal findings: Secondary | ICD-10-CM | POA: Diagnosis not present

## 2020-08-03 DIAGNOSIS — Z0000 Encounter for general adult medical examination without abnormal findings: Secondary | ICD-10-CM | POA: Diagnosis not present

## 2020-08-03 LAB — POCT URINE PREGNANCY: Preg Test, Ur: NEGATIVE

## 2020-08-03 MED ORDER — NORETHINDRONE 0.35 MG PO TABS: 1.0000 | ORAL_TABLET | Freq: Every day | ORAL | 4 refills | Status: DC

## 2020-08-03 NOTE — Progress Notes (Signed)
Patient ID: Pamela Wells, female   DOB: Mar 12, 2001, 20 y.o.   MRN: [id number] History of Present Illness: Pamela Wells is a 20 year old black female,single, G0P0, in for a well woman gyn exam and her first time, she has not had a period in 2 months. She is a Holiday representative at [employer]. PCP is Tereasa Coop NP   Current Medications, Allergies, Past Medical History, Past Surgical History, Family History and Social History were reviewed in [employer] record.     Review of Systems: Patient denies any  hearing loss, fatigue, blurred vision, shortness of breath, chest pain, abdominal pain, problems with bowel movements, urination, or intercourse. No joint pain or mood swings. No period in 2 months She has migraines with auras she says    Physical Exam:BP 115/75 (BP Location: Left Arm, Patient Position: Sitting, Cuff Size: Large)   Pulse 68   Ht 5\' 4"  (1.626 m)   Wt 209 lb 8 oz (95 kg)   BMI 35.96 kg/m UPT is negative.  General:  Well developed, well nourished, no acute distress Skin:  Warm and dry Neck:  Midline trachea, normal thyroid, good ROM, no lymphadenopathy Lungs; Clear to auscultation bilaterally Breast:  No dominant palpable mass, retraction, or nipple discharge Cardiovascular: Regular rate and rhythm Abdomen:  Soft, non tender, no hepatosplenomegaly Pelvic:  External genitalia is normal in appearance, no lesions.  The vagina is normal in appearance. Urethra has no lesions or masses. The cervix is smooth, pap with GC/CHL performed.  Uterus is felt to be normal size, shape, and contour.  No adnexal masses or tenderness noted.Bladder is non tender, no masses felt. Extremities/musculoskeletal:  No swelling or varicosities noted, no clubbing or cyanosis Psych:  No mood changes, alert and cooperative,seems happy AA is 1 Fall risk is low PHQ 9 score is 0 GAD 7 score is 0  Upstream - 08/03/20 1103      Pregnancy Intention Screening   Does the patient want to  become pregnant in the next year? No    Does the patient's partner want to become pregnant in the next year? No    Would the patient like to discuss contraceptive options today? No      Contraception Wrap Up   Current Method Female Condom    End Method Female Condom;Oral Contraceptive    Contraception Counseling Provided Yes         Examination chaperoned by 08/05/20 LPN  Impression and Plan: 1. Routine medical exam  2. Pregnancy examination or test, negative result   3. Encounter for gynecological examination with Papanicolaou smear of cervix Pap sent Physical in 1 year Pap in 3 if normal  4. Irregular periods Will try to cycle with OCs, will do POP due to migraines with aura  Follow up in 3 months   5. Encounter for initial prescription of contraceptive pills Will Rx Micronor, can start now or Sunday, use condoms Meds ordered this encounter  Medications  . norethindrone (MICRONOR) 0.35 MG tablet    Sig: Take 1 tablet (0.35 mg total) by mouth daily.    Dispense:  84 tablet    Refill:  4    Order Specific Question:   Supervising Provider    Answer:   Saturday H [[id number]]

## 2020-08-09 ENCOUNTER — Encounter: Payer: Self-pay | Admitting: Adult Health

## 2020-08-09 DIAGNOSIS — R87610 Atypical squamous cells of undetermined significance on cytologic smear of cervix (ASC-US): Secondary | ICD-10-CM | POA: Insufficient documentation

## 2020-08-09 HISTORY — DX: Atypical squamous cells of undetermined significance on cytologic smear of cervix (ASC-US): R87.610

## 2020-08-09 LAB — CYTOLOGY - PAP
Adequacy: ABSENT
Chlamydia: NEGATIVE
Comment: NEGATIVE
Comment: NEGATIVE
Comment: NORMAL
Diagnosis: UNDETERMINED — AB
High risk HPV: NEGATIVE
Neisseria Gonorrhea: NEGATIVE

## 2020-10-22 ENCOUNTER — Encounter: Payer: Self-pay | Admitting: Nurse Practitioner

## 2020-10-22 ENCOUNTER — Ambulatory Visit (INDEPENDENT_AMBULATORY_CARE_PROVIDER_SITE_OTHER): Payer: BLUE CROSS/BLUE SHIELD | Admitting: Nurse Practitioner

## 2020-10-22 ENCOUNTER — Other Ambulatory Visit: Payer: Self-pay

## 2020-10-22 DIAGNOSIS — K219 Gastro-esophageal reflux disease without esophagitis: Secondary | ICD-10-CM | POA: Insufficient documentation

## 2020-10-22 MED ORDER — OMEPRAZOLE 40 MG PO CPDR: 40.0000 mg | DELAYED_RELEASE_CAPSULE | Freq: Every day | ORAL | 1 refills | Status: DC

## 2020-10-22 NOTE — Progress Notes (Signed)
Acute Office Visit  Subjective:    Patient ID: Pamela Wells, female    DOB: April 04, 2001, 20 y.o.   MRN: [id number]  Chief Complaint  Patient presents with  . Gastroesophageal Reflux    Worsening x1 month ago, feels like it is aggravating her asthma. This is worse at night.    HPI Patient is in today for abdominal pain that is worse at night.  She has tried nexium, and sometimes that works and sometimes it doesn't.  She has tried tums, but that didn't help.  She states that certain foods trigger her.  Past Medical History:  Diagnosis Date  . Abdominal pain, RUQ (right upper quadrant) 09/03/2017  . ASCUS of cervix with negative high risk HPV 08/09/2020   Repeat in 1 year.  . Asthma    no inhaler use in the past 2 months. 09-08-17  . 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 Chlamydia 03/05/2020   Treated 8/30, POC_________  . Constipation 07/17/2017  . Dyspepsia   . Food allergy   . GERD (gastroesophageal reflux disease)   . Intractable chronic migraine without aura and without status migrainosus 02/03/2019  . Menorrhagia with regular cycle 07/12/2019  . Migraines    migraines  . Multiple food allergies 07/17/2017  . Pneumonia 2018  . Post-nasal drip    pt states "sinus disease"   . 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 Urticaria     Past Surgical History:  Procedure Laterality Date  . ESOPHAGOGASTRODUODENOSCOPY N/A 09/25/2017   Procedure: ESOPHAGOGASTRODUODENOSCOPY (EGD);  Surgeon: West Bali, MD;  [facility];  Service: Endoscopy;  Laterality: N/A;  9:30am  . TONGUE SURGERY     HAD FRENULUM CLIPPED    Family History  Problem Relation Age of Onset  . Hypertension Father   . Diabetes Father   . Ovarian cancer Maternal Grandmother   . Stroke Maternal Grandfather   . Colon cancer Neg Hx   . Colon polyps Neg Hx     Social History   Socioeconomic History  . Marital status: Single    Spouse name: Not on file  . Number of children: 1  . Years of education:  Not on file  . Highest education level: Some college, no degree  Occupational History    Comment: Psychologist, counselling, [employer]  Tobacco Use  . Smoking status: Never Smoker  . Smokeless tobacco: Never Used  Vaping Use  . Vaping Use: Never used  Substance and Sexual Activity  . Alcohol use: Yes    Comment: rarely  . Drug use: No  . Sexual activity: Yes    Birth control/protection: Condom, None  Other Topics Concern  . Not on file  Social History Narrative   In college, [place]    Eats all food groups.   Wears seatbelt.    Drives a car.    Does not smoke.    Reports that is religious and believes in God.   Reports she is not sexually active.  Denies any tobacco, alcohol, or drug use.   Social Determinants of Health   Financial Resource Strain: Unknown  . Difficulty of Paying Living Expenses: Patient refused  Food Insecurity: Food Insecurity Present  . Worried About Programme researcher, broadcasting/film/video in the Last Year: Sometimes true  . Ran Out of Food in the Last Year: Sometimes true  Transportation Needs: No Transportation Needs  . Lack of Transportation (Medical): No  . Lack of Transportation (Non-Medical): No  Physical Activity: Sufficiently Active  .  Days of Exercise per Week: 5 days  . Minutes of Exercise per Session: 30 min  Stress: Stress Concern Present  . Feeling of Stress : To some extent  Social Connections: Socially Isolated  . Frequency of Communication with Friends and Family: More than three times a week  . Frequency of Social Gatherings with Friends and Family: More than three times a week  . Attends Religious Services: Never  . Active Member of Clubs or Organizations: No  . Attends Banker Meetings: Never  . Marital Status: Never married  Intimate Partner Violence: Not At Risk  . Fear of Current or Ex-Partner: No  . Emotionally Abused: No  . Physically Abused: No  . Sexually Abused: No    Outpatient Medications Prior to Visit  Medication Sig Dispense Refill   . albuterol (VENTOLIN HFA) 108 (90 Base) MCG/ACT inhaler Inhale 1-2 puffs into the lungs every 6 (six) hours as needed for wheezing or shortness of breath. 18 g 2  . amitriptyline (ELAVIL) 10 MG tablet Take 1 tablet (10 mg total) by mouth at bedtime. 90 tablet 3  . ibuprofen (ADVIL) 800 MG tablet Take by mouth.    . montelukast (SINGULAIR) 10 MG tablet Take 1 tablet (10 mg total) by mouth at bedtime. 90 tablet 3  . norethindrone (MICRONOR) 0.35 MG tablet Take 1 tablet (0.35 mg total) by mouth daily. 84 tablet 4  . rizatriptan (MAXALT-MLT) 10 MG disintegrating tablet Take 1 tablet (10 mg total) by mouth as needed for migraine. May repeat in 2 hours if needed 9 tablet 11  . topiramate (TOPAMAX) 100 MG tablet Take 1 tablet (100 mg total) by mouth 2 (two) times daily. 180 tablet 3   No facility-administered medications prior to visit.    Allergies  Allergen Reactions  . Lactose Intolerance (Gi)   . Peanut-Containing Drug Products Hives  . Sesame Oil   . Mixed Grasses   . Pollen Extract     Review of Systems  Gastrointestinal: Positive for abdominal distention, abdominal pain and nausea. Negative for constipation, diarrhea and vomiting.       Objective:    Physical Exam  Ht 5\' 4"  (1.626 m)   Wt 209 lb (94.8 kg)   BMI 35.87 kg/m  Wt Readings from Last 3 Encounters:  10/22/20 209 lb (94.8 kg)  08/03/20 209 lb 8 oz (95 kg)  06/20/20 185 lb (83.9 kg)    There are no preventive care reminders to display for this patient.  There are no preventive care reminders to display for this patient.   Lab Results  Component Value Date   TSH 0.65 07/17/2017   Lab Results  Component Value Date   WBC 6.3 01/10/2019   HGB 12.8 01/10/2019   HCT 38.4 01/10/2019   MCV 92.1 01/10/2019   PLT 226 01/10/2019   Lab Results  Component Value Date   NA 138 01/10/2019   K 3.7 01/10/2019   CO2 23 01/10/2019   GLUCOSE 110 (H) 01/10/2019   BUN 16 01/10/2019   CREATININE 0.65 01/10/2019    BILITOT 0.7 07/17/2017   AST 17 07/17/2017   ALT 12 07/17/2017   PROT 7.4 07/17/2017   CALCIUM 9.0 01/10/2019   ANIONGAP 11 01/10/2019   No results found for: CHOL No results found for: HDL No results found for: LDLCALC No results found for: TRIG No results found for: [facility] Lab Results  Component Value Date   HGBA1C 5.3 02/03/2019  Assessment & Plan:   Problem List Items Addressed This Visit      Digestive   GERD (gastroesophageal reflux disease)    -Rx. Omeprazole x 2 months; then decrease to OTC dosage of 20 mg -we discussed using mylanta PRN -if no improvement in 2 weeks, will consider GI consult      Relevant Medications   omeprazole (PRILOSEC) 40 MG capsule       Meds ordered this encounter  Medications  . omeprazole (PRILOSEC) 40 MG capsule    Sig: Take 1 capsule (40 mg total) by mouth daily.    Dispense:  30 capsule    Refill:  1    After she uses her refill (60 days), would like for her to use OTC dose (20 mg per day).     Heather Roberts, NP

## 2020-10-22 NOTE — Assessment & Plan Note (Signed)
-  Rx. Omeprazole x 2 months; then decrease to OTC dosage of 20 mg -we discussed using mylanta PRN -if no improvement in 2 weeks, will consider GI consult

## 2020-11-01 ENCOUNTER — Telehealth (INDEPENDENT_AMBULATORY_CARE_PROVIDER_SITE_OTHER): Payer: Medicaid Other | Admitting: Adult Health

## 2020-11-01 ENCOUNTER — Encounter: Payer: Self-pay | Admitting: Adult Health

## 2020-11-01 DIAGNOSIS — Z3041 Encounter for surveillance of contraceptive pills: Secondary | ICD-10-CM | POA: Diagnosis not present

## 2020-11-01 NOTE — Progress Notes (Signed)
Patient ID: Pamela Wells, female   DOB: Jan 06, 2001, 20 y.o.   MRN: [id number]   TELEHEALTH GYNECOLOGY VISIT ENCOUNTER NOTE  Provider location: [facility]  Patient location: Home  I connected with [name] on 11/01/20 at  4:00 PM EDT by telephone and verified that I am speaking with the correct person using two identifiers. Patient was unable to do MyChart audiovisual encounter due to technical difficulties, she tried several times.    I discussed the limitations, risks, security and privacy concerns of performing an evaluation and management service by telephone and the availability of in person appointments. I also discussed with the patient that there may be a patient responsible charge related to this service. The patient expressed understanding and agreed to proceed.   History:  [name] is a 20 y.o. G0P0000 female being evaluated today for starting Micronor in January for irregular periods and has migraines with aura and she says periods regular and lighter, no headaches, but says breasts bigger. She denies any abnormal vaginal discharge, pelvic pain or other concerns.       Past Medical History:  Diagnosis Date  . Abdominal pain, RUQ (right upper quadrant) 09/03/2017  . ASCUS of cervix with negative high risk HPV 08/09/2020   Repeat in 1 year.  . Asthma    no inhaler use in the past 2 months. 09-08-17  . 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 Chlamydia 03/05/2020   Treated 8/30, POC_________  . Constipation 07/17/2017  . Dyspepsia   . Food allergy   . GERD (gastroesophageal reflux disease)   . Intractable chronic migraine without aura and without status migrainosus 02/03/2019  . Menorrhagia with regular cycle 07/12/2019  . Migraines    migraines  . Multiple food allergies 07/17/2017  . Pneumonia 2018  . Post-nasal drip    pt states "sinus disease"   . 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 Urticaria    Past Surgical History:   Procedure Laterality Date  . ESOPHAGOGASTRODUODENOSCOPY N/A 09/25/2017   Procedure: ESOPHAGOGASTRODUODENOSCOPY (EGD);  Surgeon: West Bali, MD;  [facility]: [facility];  Service: Endoscopy;  Laterality: N/A;  9:30am  . TONGUE SURGERY     HAD FRENULUM CLIPPED   The following portions of the patient's history were reviewed and updated as appropriate: allergies, current medications, past family history, past medical history, past social history, past surgical history and problem list.   Health Maintenance: Had first pap 08/03/20 ASCUS.  Review of Systems:  Pertinent items noted in HPI and remainder of comprehensive ROS otherwise negative.  Physical Exam:   General:  Alert, oriented and cooperative.   Mental Status: Normal mood and affect perceived. Normal judgment and thought content.  Physical exam deferred due to nature of the encounter  [facility] - 11/01/20 1620      Pregnancy Intention Screening   Does the patient want to become pregnant in the next year? No    Does the patient's partner want to become pregnant in the next year? No    Would the patient like to discuss contraceptive options today? No      Contraception Wrap Up   Current Method Oral Contraceptive    End Method Oral Contraceptive    Contraception Counseling Provided No          Labs and Imaging No results found for this or any previous visit (from the past 336 hour(s)). No results found.    Assessment and Plan:     1. Encounter  for surveillance of contraceptive pills Continue micronor, has refills Pap in January 2023       I discussed the assessment and treatment plan with the patient. The patient was provided an opportunity to ask questions and all were answered. The patient agreed with the plan and demonstrated an understanding of the instructions.   The patient was advised to call back or seek an in-person evaluation/go to the ED if the symptoms worsen or if the condition fails to improve as  anticipated.  I provided 5 minutes of non-face-to-face time during this encounter. I was in my office at [facility] during this encounter.   Cyril Mourning, NP [facility] [employer], [facility]

## 2020-11-05 ENCOUNTER — Telehealth: Payer: Medicaid Other | Admitting: Family Medicine

## 2020-11-05 NOTE — Progress Notes (Deleted)
PATIENT: Pamela Wells DOB: 07-30-2000  REASON FOR VISIT: follow up HISTORY FROM: patient  Virtual Visit via Telephone Note  I connected with Pamela Wells on 11/05/20 at  2:00 PM EDT by telephone and verified that I am speaking with the correct person using two identifiers.   I discussed the limitations, risks, security and privacy concerns of performing an evaluation and management service by telephone and the availability of in person appointments. I also discussed with the patient that there may be a patient responsible charge related to this service. The patient expressed understanding and agreed to proceed.   History of Present Illness:  11/05/20 ALL: Pamela Wells is a 20 y.o. female here today for follow up. She continues topiramate 50mg  and amitriptyline 10mg  daily. Rizatriptan works well for abortive therapy.    11/03/19 ALL:  Pamela Wells is a 20 y.o. female here today for follow up for migraines. She continues topiramate 100mg  BID and amitriptyline 10mg  at bedtime. She has tolerated lower dose of amitriptyline much better. Rizatriptan helps with abortive therapy. She does report a period of about 2 months (2/14 - end of March) where she discontinued her medications due to missed periord. Menstrual cycles are abnormal due to changes of OCP. She was having difficulty with side effects and discontinued birth control. She has been back on topiramate and amitriptyline for the past 3-4 weeks. She does report having two "bad headaches" over the past two months. Last one was yesterday. She was able to abort migraine with rizatriptan. She denies new or worrisome symptoms.    HISTORY: (copied from my note on 05/05/2019)  Pamela Wells a 20 y.o.femalehere today for follow up for migraines. She reports that headaches have improved significantly since starting topiramate 100mg  twice daily. She continues amitriptyline 25mg  at night. She feels that there are too  many days when she wakes up feeling groggy. She does not sleep well. She is in college studying nursing and has a fluctuating sleep schedule.She has taken rizatriptan twice since July. This medication works very well to abort migraines. She denies any adverse effects with medication. She has scheduled follow-up with PCP and GYN to discuss appropriate forms of birth control.   HISTORY: (copied fromDr Ahern'snote on 02/03/2019)  05/07/2019 Wells a 20 y.o.femalehere as requested by the emergency room for migraines. She has a PMHx of migraines and had a workup including MRI of the brain without etiology. She tried Melatonin 1000mg , she is not sleeping. She tried Fioricet, Topamax, she stopped the Topamax because she had to be on antibiotics and felt the combo made her feel bad, she restarted Topamax and still on it, likely 25mg  once daily (in prior notes was given Topamax 25mg  twice daily with increase to 50mg  bid but she never did that and only on 25mg  once daily now. She went to the ED recently due to severe migraines, she does get left arm and leg numbness and tingling but her whole left side was painful, she couldn't move her neck. She did NOT take her medications when the migraine started and then went to ED. Imitrex did not work. SHe has migraines with nausea, migraine on the left side, throbbing in the back and in the neck, light and sound sensitivity, ongoing for a year. No family history. She can have dry heaves and nausea. They can be moderately severe to severe. She does not have a pcp, she needs one asked her. She has an aura, dots, precedes the migraine and also get sensory  changes in the left side, she has dizziness. No OTC medication.  Reviewed notes, labs and imaging from outside physicians, which showed:  tsh normal  Cb/cmp with elevated gkucose  Reviewed report 08/2018 MRI brain w/wo cont: MRI brain normal. Sinus  disease.    Observations/Objective:  Generalized: Well developed, in no acute distress  Mentation: Alert oriented to time, place, history taking. Follows all commands speech and language fluent   Assessment and Plan:  20 y.o. 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 here with    ICD-10-CM   1. Migraine with aura and without status migrainosus, not intractable  G43.109     No orders of the defined types were placed in this encounter.   No orders of the defined types were placed in this encounter.    Follow Up Instructions:  I discussed the assessment and treatment plan with the patient. The patient was provided an opportunity to ask questions and all were answered. The patient agreed with the plan and demonstrated an understanding of the instructions.   The patient was advised to call back or seek an in-person evaluation if the symptoms worsen or if the condition fails to improve as anticipated.  I provided *** minutes of non-face-to-face time during this encounter. Patient located at their place of residence during Mychart visit. Provider is in the office.    Shawnie Dapper, NP

## 2020-11-06 ENCOUNTER — Other Ambulatory Visit: Payer: Self-pay

## 2020-11-06 ENCOUNTER — Ambulatory Visit
Admission: EM | Admit: 2020-11-06 | Discharge: 2020-11-06 | Disposition: A | Discharge status: Home / Self Care | Payer: Medicaid Other | Attending: Family Medicine | Admitting: Family Medicine

## 2020-11-06 ENCOUNTER — Encounter: Payer: Self-pay | Admitting: Emergency Medicine

## 2020-11-06 DIAGNOSIS — R197 Diarrhea, unspecified: Secondary | ICD-10-CM

## 2020-11-06 DIAGNOSIS — R112 Nausea with vomiting, unspecified: Secondary | ICD-10-CM

## 2020-11-06 DIAGNOSIS — R42 Dizziness and giddiness: Secondary | ICD-10-CM

## 2020-11-06 LAB — POCT FASTING CBG KUC MANUAL ENTRY: POCT Glucose (KUC): 93 mg/dL (ref 70–99)

## 2020-11-06 MED ORDER — ONDANSETRON 4 MG PO TBDP: 4.0000 mg | ORAL_TABLET | Freq: Three times a day (TID) | ORAL | 0 refills | Status: DC | PRN

## 2020-11-06 NOTE — Discharge Instructions (Signed)
Please do your best to ensure adequate fluid intake in order to avoid dehydration. If you find that you are unable to tolerate drinking fluids regularly please proceed to the Emergency Department for evaluation. ° ° °

## 2020-11-06 NOTE — ED Triage Notes (Signed)
Having diarrhea that started today, LT hand was shaking and foot became itchy and HR went up to 103.  Pt states she has been on low carb diet for past 2 weeks.

## 2020-11-07 NOTE — ED Provider Notes (Signed)
[facility]   [id number] 11/06/20 Arrival Time: 1755  ASSESSMENT & PLAN:  1. Diarrhea, unspecified type   2. Episodic lightheadedness   3. Non-intractable vomiting with nausea, unspecified vomiting type    Question beginning of viral GI illness. Discussed. No signs of dehydration. She is comfortable with home observation. CBG 93 here. Marland Kitchen  Discharge Instructions     Please do your best to ensure adequate fluid intake in order to avoid dehydration. If you find that you are unable to tolerate drinking fluids regularly please proceed to the Emergency Department for evaluation.      If needed: Meds ordered this encounter  Medications  . ondansetron (ZOFRAN-ODT) 4 MG disintegrating tablet    Sig: Take 1 tablet (4 mg total) by mouth every 8 (eight) hours as needed for nausea or vomiting.    Dispense:  15 tablet    Refill:  0    Discussed typical duration of symptoms for suspected viral GI illness. Will proceed to the Emergency Department for evaluation if unable to tolerate PO fluids regularly.  Reviewed expectations re: course of current medical issues. Questions answered. Outlined signs and symptoms indicating need for more acute intervention. Patient verbalized understanding. After Visit Summary given.   SUBJECTIVE: History from: patient.  Pamela Wells is a 20 y.o. female who presents with complaint of non-bilious, non-bloody intermittent n/v with non-bloody diarrhea. Onset yesterday. Abdominal discomfort: mild and cramping. Symptoms are stable since beginning. Aggravating factors: PO intake. Alleviating factors: none identified. Associated symptoms: lightheadedness with increased heart rate x 1 today; sev min then resolved; no CP/SOB. She denies chills and fever. Appetite: decreased. PO intake: decreased. Ambulatory without assistance. Urinary symptoms: none. Sick contacts: none. Recent travel or camping: none. OTC treatment: none.  No LMP recorded.  (Menstrual status: Oral contraceptives).  Past Surgical History:  Procedure Laterality Date  . ESOPHAGOGASTRODUODENOSCOPY N/A 09/25/2017   Procedure: ESOPHAGOGASTRODUODENOSCOPY (EGD);  Surgeon: West Bali, MD;  [facility];  Service: Endoscopy;  Laterality: N/A;  9:30am  . TONGUE SURGERY     HAD FRENULUM CLIPPED    ROS: As per HPI.  OBJECTIVE:  Vitals:   11/06/20 1825  BP: 118/79  Pulse: 81  Resp: 17  Temp: 98.9 F (37.2 C)  TempSrc: Oral  SpO2: 98%    General appearance: alert; no distress Oropharynx: moist Lungs: unlabored Heart: regular Abdomen: soft; non-distended; no significant abdominal tenderness; bowel sounds present; no masses or organomegaly; no guarding or rebound tenderness Back: no CVA tenderness Extremities: no edema; symmetrical with no gross deformities Skin: warm; dry Neurologic: normal gait Psychological: alert and cooperative; normal mood and affect  Labs: Results for orders placed or performed during the hospital encounter of 11/06/20  POCT CBG (manual entry)  Result Value Ref Range   POCT Glucose ([facility]) 93 70 - 99 mg/dL   Labs Reviewed  POCT FASTING CBG [facility] MANUAL ENTRY    Allergies  Allergen Reactions  . Lactose Intolerance (Gi)   . Peanut-Containing Drug Products Hives  . Sesame Oil   . Mixed Grasses   . Pollen Extract                                                Past Medical History:  Diagnosis Date  . Abdominal pain, RUQ (right upper quadrant) 09/03/2017  . ASCUS of cervix with negative high  risk HPV 08/09/2020   Repeat in 1 year.  . Asthma    no inhaler use in the past 2 months. 09-08-17  . 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 Chlamydia 03/05/2020   Treated 8/30, POC_________  . Constipation 07/17/2017  . Dyspepsia   . Food allergy   . GERD (gastroesophageal reflux disease)   . Intractable chronic migraine without aura and without status migrainosus 02/03/2019  . Menorrhagia with regular cycle 07/12/2019  . Migraines     migraines  . Multiple food allergies 07/17/2017  . Pneumonia 2018  . Post-nasal drip    pt states "sinus disease"   . 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 Urticaria    Social History   Socioeconomic History  . Marital status: Single    Spouse name: Not on file  . Number of children: 1  . Years of education: Not on file  . Highest education level: Some college, no degree  Occupational History    Comment: Psychologist, counselling, [employer]  Tobacco Use  . Smoking status: Never Smoker  . Smokeless tobacco: Never Used  Vaping Use  . Vaping Use: Never used  Substance and Sexual Activity  . Alcohol use: Yes    Comment: rarely  . Drug use: No  . Sexual activity: Yes    Birth control/protection: Condom, Pill  Other Topics Concern  . Not on file  Social History Narrative   In college, [place]    Eats all food groups.   Wears seatbelt.    Drives a car.    Does not smoke.    Reports that is religious and believes in God.   Reports she is not sexually active.  Denies any tobacco, alcohol, or drug use.   Social Determinants of Health   Financial Resource Strain: Unknown  . Difficulty of Paying Living Expenses: Patient refused  Food Insecurity: Food Insecurity Present  . Worried About Programme researcher, broadcasting/film/video in the Last Year: Sometimes true  . Ran Out of Food in the Last Year: Sometimes true  Transportation Needs: No Transportation Needs  . Lack of Transportation (Medical): No  . Lack of Transportation (Non-Medical): No  Physical Activity: Sufficiently Active  . Days of Exercise per Week: 5 days  . Minutes of Exercise per Session: 30 min  Stress: Stress Concern Present  . Feeling of Stress : To some extent  Social Connections: Socially Isolated  . Frequency of Communication with Friends and Family: More than three times a week  . Frequency of Social Gatherings with Friends and Family: More than three times a week  . Attends Religious Services: Never  . Active Member of Clubs or  Organizations: No  . Attends Banker Meetings: Never  . Marital Status: Never married  Intimate Partner Violence: Not At Risk  . Fear of Current or Ex-Partner: No  . Emotionally Abused: No  . Physically Abused: No  . Sexually Abused: No   Family History  Problem Relation Age of Onset  . Hypertension Father   . Diabetes Father   . Ovarian cancer Maternal Grandmother   . Stroke Maternal Grandfather   . Colon cancer Neg Hx   . Colon polyps Neg Hx      Mardella Layman, MD 11/07/20 1024

## 2020-11-12 NOTE — Progress Notes (Deleted)
PATIENT: Pamela Wells DOB: 05/26/2001  REASON FOR VISIT: follow up HISTORY FROM: patient  Virtual Visit via Telephone Note  I connected with Pamela Wells on 11/12/20 at  1:30 PM EDT by telephone and verified that I am speaking with the correct person using two identifiers.   I discussed the limitations, risks, security and privacy concerns of performing an evaluation and management service by telephone and the availability of in person appointments. I also discussed with the patient that there may be a patient responsible charge related to this service. The patient expressed understanding and agreed to proceed.   History of Present Illness:  11/12/20 ALL: Pamela Wells is a 20 y.o. female here today for follow up. She continues topiramate 50mg  and amitriptyline 10mg  daily. Rizatriptan works well for abortive therapy.    11/03/19 ALL:  Pamela Wells is a 20 y.o. female here today for follow up for migraines. She continues topiramate 100mg  BID and amitriptyline 10mg  at bedtime. She has tolerated lower dose of amitriptyline much better. Rizatriptan helps with abortive therapy. She does report a period of about 2 months (2/14 - end of March) where she discontinued her medications due to missed periord. Menstrual cycles are abnormal due to changes of OCP. She was having difficulty with side effects and discontinued birth control. She has been back on topiramate and amitriptyline for the past 3-4 weeks. She does report having two "bad headaches" over the past two months. Last one was yesterday. She was able to abort migraine with rizatriptan. She denies new or worrisome symptoms.    HISTORY: (copied from my note on 05/05/2019)  Pamela [name] a 20 y.o.femalehere today for follow up for migraines. She reports that headaches have improved significantly since starting topiramate 100mg  twice daily. She continues amitriptyline 25mg  at night. She feels that there are too  many days when she wakes up feeling groggy. She does not sleep well. She is in college studying nursing and has a fluctuating sleep schedule.She has taken rizatriptan twice since July. This medication works very well to abort migraines. She denies any adverse effects with medication. She has scheduled follow-up with PCP and GYN to discuss appropriate forms of birth control.   HISTORY: (copied fromDr Ahern'snote on 02/03/2019)  05/07/2019 [name] a 20 y.o.femalehere as requested by the emergency room for migraines. She has a PMHx of migraines and had a workup including MRI of the brain without etiology. She tried Melatonin 1000mg , she is not sleeping. She tried Fioricet, Topamax, she stopped the Topamax because she had to be on antibiotics and felt the combo made her feel bad, she restarted Topamax and still on it, likely 25mg  once daily (in prior notes was given Topamax 25mg  twice daily with increase to 50mg  bid but she never did that and only on 25mg  once daily now. She went to the ED recently due to severe migraines, she does get left arm and leg numbness and tingling but her whole left side was painful, she couldn't move her neck. She did NOT take her medications when the migraine started and then went to ED. Imitrex did not work. SHe has migraines with nausea, migraine on the left side, throbbing in the back and in the neck, light and sound sensitivity, ongoing for a year. No family history. She can have dry heaves and nausea. They can be moderately severe to severe. She does not have a pcp, she needs one asked her. She has an aura, dots, precedes the migraine and also get sensory  changes in the left side, she has dizziness. No OTC medication.  Reviewed notes, labs and imaging from outside physicians, which showed:  tsh normal  Cb/cmp with elevated gkucose  Reviewed report 08/2018 MRI brain w/wo cont: MRI brain normal. Sinus  disease.    Observations/Objective:  Generalized: Well developed, in no acute distress  Mentation: Alert oriented to time, place, history taking. Follows all commands speech and language fluent   Assessment and Plan:  20 y.o. 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 here with  No diagnosis found.  No orders of the defined types were placed in this encounter.   No orders of the defined types were placed in this encounter.    Follow Up Instructions:  I discussed the assessment and treatment plan with the patient. The patient was provided an opportunity to ask questions and all were answered. The patient agreed with the plan and demonstrated an understanding of the instructions.   The patient was advised to call back or seek an in-person evaluation if the symptoms worsen or if the condition fails to improve as anticipated.  I provided *** minutes of non-face-to-face time during this encounter. Patient located at their place of residence during Mychart visit. Provider is in the office.    Shawnie Dapper, NP

## 2020-11-12 NOTE — Patient Instructions (Incomplete)
Below is our plan:  We will ***  Please make sure you are staying well hydrated. I recommend 50-60 ounces daily. Well balanced diet and regular exercise encouraged. Consistent sleep schedule with 6-8 hours recommended.   Please continue follow up with care team as directed.   Follow up with *** in ***  You may receive a survey regarding today's visit. I encourage you to leave honest feed back as I do use this information to improve patient care. Thank you for seeing me today!      Migraine Headache A migraine headache is a very strong throbbing pain on one side or both sides of your head. This type of headache can also cause other symptoms. It can last from 4 hours to 3 days. Talk with your doctor about what things may bring on (trigger) this condition. What are the causes? The exact cause of this condition is not known. This condition may be triggered or caused by:  Drinking alcohol.  Smoking.  Taking medicines, such as: ? Medicine used to treat chest pain (nitroglycerin). ? Birth control pills. ? Estrogen. ? Some blood pressure medicines.  Eating or drinking certain products.  Doing physical activity. Other things that may trigger a migraine headache include:  Having a menstrual period.  Pregnancy.  Hunger.  Stress.  Not getting enough sleep or getting too much sleep.  Weather changes.  Tiredness (fatigue). What increases the risk?  Being 25-55 years old.  Being female.  Having a family history of migraine headaches.  Being Caucasian.  Having depression or anxiety.  Being very overweight. What are the signs or symptoms?  A throbbing pain. This pain may: ? Happen in any area of the head, such as on one side or both sides. ? Make it hard to do daily activities. ? Get worse with physical activity. ? Get worse around bright lights or loud noises.  Other symptoms may include: ? Feeling sick to your stomach  (nauseous). ? Vomiting. ? Dizziness. ? Being sensitive to bright lights, loud noises, or smells.  Before you get a migraine headache, you may get warning signs (an aura). An aura may include: ? Seeing flashing lights or having blind spots. ? Seeing bright spots, halos, or zigzag lines. ? Having tunnel vision or blurred vision. ? Having numbness or a tingling feeling. ? Having trouble talking. ? Having weak muscles.  Some people have symptoms after a migraine headache (postdromal phase), such as: ? Tiredness. ? Trouble thinking (concentrating). How is this treated?  Taking medicines that: ? Relieve pain. ? Relieve the feeling of being sick to your stomach. ? Prevent migraine headaches.  Treatment may also include: ? Having acupuncture. ? Avoiding foods that bring on migraine headaches. ? Learning ways to control your body functions (biofeedback). ? Therapy to help you know and deal with negative thoughts (cognitive behavioral therapy). Follow these instructions at home: Medicines  Take over-the-counter and prescription medicines only as told by your doctor.  Ask your doctor if the medicine prescribed to you: ? Requires you to avoid driving or using heavy machinery. ? Can cause trouble pooping (constipation). You may need to take these steps to prevent or treat trouble pooping:  Drink enough fluid to keep your pee (urine) pale yellow.  Take over-the-counter or prescription medicines.  Eat foods that are high in fiber. These include beans, whole grains, and fresh fruits and vegetables.  Limit foods that are high in fat and sugar. These include fried or sweet foods. Lifestyle    Do not drink alcohol.  Do not use any products that contain nicotine or tobacco, such as cigarettes, e-cigarettes, and chewing tobacco. If you need help quitting, ask your doctor.  Get at least 8 hours of sleep every night.  Limit and deal with stress. General instructions  Keep a journal to  find out what may bring on your migraine headaches. For example, write down: ? What you eat and drink. ? How much sleep you get. ? Any change in what you eat or drink. ? Any change in your medicines.  If you have a migraine headache: ? Avoid things that make your symptoms worse, such as bright lights. ? It may help to lie down in a dark, quiet room. ? Do not drive or use heavy machinery. ? Ask your doctor what activities are safe for you.  Keep all follow-up visits as told by your doctor. This is important.      Contact a doctor if:  You get a migraine headache that is different or worse than others you have had.  You have more than 15 headache days in one month. Get help right away if:  Your migraine headache gets very bad.  Your migraine headache lasts longer than 72 hours.  You have a fever.  You have a stiff neck.  You have trouble seeing.  Your muscles feel weak or like you cannot control them.  You start to lose your balance a lot.  You start to have trouble walking.  You pass out (faint).  You have a seizure. Summary  A migraine headache is a very strong throbbing pain on one side or both sides of your head. These headaches can also cause other symptoms.  This condition may be treated with medicines and changes to your lifestyle.  Keep a journal to find out what may bring on your migraine headaches.  Contact a doctor if you get a migraine headache that is different or worse than others you have had.  Contact your doctor if you have more than 15 headache days in a month. This information is not intended to replace advice given to you by your health care provider. Make sure you discuss any questions you have with your health care provider. Document Revised: 10/15/2018 Document Reviewed: 08/05/2018 Elsevier Patient Education  2021 Elsevier Inc.  

## 2020-11-13 ENCOUNTER — Other Ambulatory Visit: Payer: Self-pay

## 2020-11-13 ENCOUNTER — Ambulatory Visit: Payer: Medicaid Other | Admitting: Family Medicine

## 2020-11-13 ENCOUNTER — Telehealth: Payer: Self-pay | Admitting: Family Medicine

## 2020-11-13 ENCOUNTER — Other Ambulatory Visit: Payer: Self-pay | Admitting: Family Medicine

## 2020-11-13 MED ORDER — TOPIRAMATE 100 MG PO TABS: 100.0000 mg | ORAL_TABLET | Freq: Two times a day (BID) | ORAL | 0 refills | Status: DC

## 2020-11-13 MED ORDER — AMITRIPTYLINE HCL 10 MG PO TABS: 10.0000 mg | ORAL_TABLET | Freq: Every day | ORAL | 0 refills | Status: DC

## 2020-11-13 MED ORDER — RIZATRIPTAN BENZOATE 10 MG PO TBDP: 10.0000 mg | ORAL_TABLET | ORAL | 2 refills | Status: DC | PRN

## 2020-11-13 NOTE — Telephone Encounter (Signed)
Prescriptions already placed for rizatriptan and amitriptyline.

## 2020-11-13 NOTE — Progress Notes (Deleted)
PATIENT: Pamela Wells DOB: [birth year]  REASON FOR VISIT: follow up HISTORY FROM: patient  No chief complaint on file.    HISTORY OF PRESENT ILLNESS: 11/05/20 ALL: Pamela Wells is a 20 y.o. female here today for follow up. She continues topiramate 50mg  and amitriptyline 10mg  daily. Rizatriptan works well for abortive therapy.    11/03/2019 ALL:  Pamela Wells is a 20 y.o. female here today for follow up for migraines. She continues topiramate 100mg  BID and amitriptyline 10mg  at bedtime. She has tolerated lower dose of amitriptyline much better. Rizatriptan helps with abortive therapy. She does report a period of about 2 months (2/14 - end of March) where she discontinued her medications due to missed periord. Menstrual cycles are abnormal due to changes of OCP. She was having difficulty with side effects and discontinued birth control. She has been back on topiramate and amitriptyline for the past 3-4 weeks. She does report having two "bad headaches" over the past two months. Last one was yesterday. She was able to abort migraine with rizatriptan. She denies new or worrisome symptoms.    HISTORY: (copied from my note on 05/05/2019)  Pamela Wells is a 20 y.o. female here today for follow up for migraines. She reports that headaches have improved significantly since starting topiramate 100mg  twice daily. She continues amitriptyline 25mg  at night. She feels that there are too many days when she wakes up feeling groggy. She does not sleep well. She is in college studying nursing and has a fluctuating sleep schedule.  She has taken rizatriptan twice since July.  This medication works very well to abort migraines.  She denies any adverse effects with medication.  She has scheduled follow-up with PCP and GYN to discuss appropriate forms of birth control.   HISTORY: (copied from Dr Pamela Wells's note on 02/03/2019)  [id number]:Pamela [name] a 20 y.o.femalehere as requested by  the emergency room for migraines. She has a PMHx of migraines and had a workup including MRI of the brain without etiology. She tried Melatonin 1000mg , she is not sleeping. She tried Fioricet, Topamax, she stopped the Topamax because she had to be on antibiotics and felt the combo made her feel bad, she restarted Topamax and still on it, likely 25mg  once daily (in prior notes was given Topamax 25mg  twice daily with increase to 50mg  bid but she never did that and only on 25mg  once daily now. She went to the ED recently due to severe migraines, she does get left arm and leg numbness and tingling but her whole left side was painful, she couldn't move her neck. She did NOT take her medications when the migraine started and then went to ED. Imitrex did not work. SHe has migraines with nausea, migraine on the left side, throbbing in the back and in the neck, light and sound sensitivity, ongoing for a year. No family history. She can have dry heaves and nausea. They can be moderately severe to severe. She does not have a pcp, she needs one asked her. She has an aura, dots, precedes the migraine and also get sensory changes in the left side, she has dizziness. No OTC medication.  Reviewed notes, labs and imaging from outside physicians, which showed:  tsh normal  Cb/cmp with elevated gkucose  Reviewed report 08/2018 MRI brain w/wo cont: MRI brain normal. Sinus disease.  REVIEW OF SYSTEMS: Out of a complete 14 system review of symptoms, the patient complains only of the following symptoms, headaches and all other reviewed systems  are negative.  ALLERGIES: Allergies  Allergen Reactions  . Lactose Intolerance (Gi)   . Peanut-Containing Drug Products Hives  . Sesame Oil   . Mixed Grasses   . Pollen Extract     HOME MEDICATIONS: Outpatient Medications Prior to Visit  Medication Sig Dispense Refill  . albuterol (VENTOLIN HFA) 108 (90 Base) MCG/ACT inhaler Inhale 1-2 puffs into the lungs every 6 (six)  hours as needed for wheezing or shortness of breath. 18 g 2  . amitriptyline (ELAVIL) 10 MG tablet Take 1 tablet (10 mg total) by mouth at bedtime. 90 tablet 3  . ibuprofen (ADVIL) 800 MG tablet Take by mouth.    . montelukast (SINGULAIR) 10 MG tablet Take 1 tablet (10 mg total) by mouth at bedtime. (Patient not taking: Reported on 11/01/2020) 90 tablet 3  . norethindrone (MICRONOR) 0.35 MG tablet Take 1 tablet (0.35 mg total) by mouth daily. 84 tablet 4  . omeprazole (PRILOSEC) 40 MG capsule Take 1 capsule (40 mg total) by mouth daily. 30 capsule 1  . ondansetron (ZOFRAN-ODT) 4 MG disintegrating tablet Take 1 tablet (4 mg total) by mouth every 8 (eight) hours as needed for nausea or vomiting. 15 tablet 0  . rizatriptan (MAXALT-MLT) 10 MG disintegrating tablet Take 1 tablet (10 mg total) by mouth as needed for migraine. May repeat in 2 hours if needed 9 tablet 11  . topiramate (TOPAMAX) 100 MG tablet Take 1 tablet (100 mg total) by mouth 2 (two) times daily. 180 tablet 3   No facility-administered medications prior to visit.    PAST MEDICAL HISTORY: Past Medical History:  Diagnosis Date  . Abdominal pain, RUQ (right upper quadrant) 09/03/2017  . ASCUS of cervix with negative high risk HPV 08/09/2020   Repeat in 1 year.  . Asthma    no inhaler use in the past 2 months. 09-08-17  . 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 Chlamydia 03/05/2020   Treated 8/30, POC_________  . Constipation 07/17/2017  . Dyspepsia   . Food allergy   . GERD (gastroesophageal reflux disease)   . Intractable chronic migraine without aura and without status migrainosus 02/03/2019  . Menorrhagia with regular cycle 07/12/2019  . Migraines    migraines  . Multiple food allergies 07/17/2017  . Pneumonia 2018  . Post-nasal drip    pt states "sinus disease"   . 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 Urticaria     PAST SURGICAL HISTORY: Past Surgical History:  Procedure Laterality Date  . ESOPHAGOGASTRODUODENOSCOPY N/A 09/25/2017    Procedure: ESOPHAGOGASTRODUODENOSCOPY (EGD);  Surgeon: West Bali, MD;  Location: [facility];  Service: Endoscopy;  Laterality: N/A;  9:30am  . TONGUE SURGERY     HAD FRENULUM CLIPPED    FAMILY HISTORY: Family History  Problem Relation Age of Onset  . Hypertension Father   . Diabetes Father   . Ovarian cancer Maternal Grandmother   . Stroke Maternal Grandfather   . Colon cancer Neg Hx   . Colon polyps Neg Hx     SOCIAL HISTORY: Social History   Socioeconomic History  . Marital status: Single    Spouse name: Not on file  . Number of children: 1  . Years of education: Not on file  . Highest education level: Some college, no degree  Occupational History    Comment: Psychologist, counselling, [employer]  Tobacco Use  . Smoking status: Never Smoker  . Smokeless tobacco: Never Used  Vaping Use  . Vaping Use: Never used  Substance  and Sexual Activity  . Alcohol use: Yes    Comment: rarely  . Drug use: No  . Sexual activity: Yes    Birth control/protection: Condom, Pill  Other Topics Concern  . Not on file  Social History Narrative   In college, [place]    Eats all food groups.   Wears seatbelt.    Drives a car.    Does not smoke.    Reports that is religious and believes in God.   Reports she is not sexually active.  Denies any tobacco, alcohol, or drug use.   Social Determinants of Health   Financial Resource Strain: Unknown  . Difficulty of Paying Living Expenses: Patient refused  Food Insecurity: Food Insecurity Present  . Worried About Programme researcher, broadcasting/film/video in the Last Year: Sometimes true  . Ran Out of Food in the Last Year: Sometimes true  Transportation Needs: No Transportation Needs  . Lack of Transportation (Medical): No  . Lack of Transportation (Non-Medical): No  Physical Activity: Sufficiently Active  . Days of Exercise per Week: 5 days  . Minutes of Exercise per Session: 30 min  Stress: Stress Concern Present  . Feeling of Stress : To some extent  Social  Connections: Socially Isolated  . Frequency of Communication with Friends and Family: More than three times a week  . Frequency of Social Gatherings with Friends and Family: More than three times a week  . Attends Religious Services: Never  . Active Member of Clubs or Organizations: No  . Attends Banker Meetings: Never  . Marital Status: Never married  Intimate Partner Violence: Not At Risk  . Fear of Current or Ex-Partner: No  . Emotionally Abused: No  . Physically Abused: No  . Sexually Abused: No      PHYSICAL EXAM  There were no vitals filed for this visit. There is no height or weight on file to calculate BMI.  Generalized: Well developed, in no acute distress  Cardiology: normal rate and rhythm, no murmur noted Respiratory: clear to auscultation bilaterally  Neurological examination  Mentation: Alert oriented to time, place, history taking. Follows all commands speech and language fluent Cranial nerve II-XII: Pupils were equal round reactive to light. Extraocular movements were full, visual field were full on confrontational test. Facial sensation and strength were normal. Uvula tongue midline. Head turning and shoulder shrug  were normal and symmetric. Motor: The motor testing reveals 5 over 5 strength of all 4 extremities. Good symmetric motor tone is noted throughout.  Sensory: Sensory testing is intact to soft touch on all 4 extremities. No evidence of extinction is noted.  Coordination: Cerebellar testing reveals good finger-nose-finger and heel-to-shin bilaterally.  Gait and station: Gait is normal.   DIAGNOSTIC DATA (LABS, IMAGING, TESTING) - I reviewed patient records, labs, notes, testing and imaging myself where available.  No flowsheet data found.   Lab Results  Component Value Date   WBC 6.3 01/10/2019   HGB 12.8 01/10/2019   HCT 38.4 01/10/2019   MCV 92.1 01/10/2019   PLT 226 01/10/2019      Component Value Date/Time   NA 138 01/10/2019  2306   K 3.7 01/10/2019 2306   CL 104 01/10/2019 2306   CO2 23 01/10/2019 2306   GLUCOSE 110 (H) 01/10/2019 2306   BUN 16 01/10/2019 2306   CREATININE 0.65 01/10/2019 2306   CREATININE 0.61 07/17/2017 1022   CALCIUM 9.0 01/10/2019 2306   PROT 7.4 07/17/2017 1022   AST 17  07/17/2017 1022   ALT 12 07/17/2017 1022   BILITOT 0.7 07/17/2017 1022   GFRNONAA >60 01/10/2019 2306   GFRAA >60 01/10/2019 2306   No results found for: CHOL, HDL, LDLCALC, LDLDIRECT, TRIG, CHOLHDL Lab Results  Component Value Date   HGBA1C 5.3 02/03/2019   No results found for: VITAMINB12 Lab Results  Component Value Date   TSH 0.65 07/17/2017       ASSESSMENT AND PLAN 20 y.o. 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 here with   No diagnosis found.   She is doing well. She has had two migraines following discontinuation of topiramate and amitriptyline for missed period. Since resuming medications, she has felt ok. Rizatriptan continues to be helpful for abortive therapy. We will continue current treatment plan. She will call me with any concerns of pregnancy and we will wean medications as needed. She was advised to avoid pregnancy. She will discuss nexplanon with GYN. She will follow-up with primary care as directed. She will discuss birth control options with GYN.  She is aware of potential increased risk of stroke due to history of migraines with aura.  She will follow-up with [place] in  1 year, sooner if needed.  She verbalizes understanding and agreement with this plan.   No orders of the defined types were placed in this encounter.    No orders of the defined types were placed in this encounter.       Christena Deem 11/13/2020, 7:22 AM [facility] [address] [place], [place] [address] [phone]

## 2020-11-13 NOTE — Telephone Encounter (Signed)
Amy - Pt needs refill for headache medication and per our conversation you will refill for 30 days and Pt will fu with PCP in regards to future care. Thank you

## 2020-11-13 NOTE — Telephone Encounter (Signed)
Patient has medicaid that does not cover visits with neurology. She was advised to speak with PCP regarding continued refills. I am happy to provide 30 day supply to allow time for her to be seen. She was advised to call with any questions or concerns. She verbalized understanding.

## 2020-11-13 NOTE — Telephone Encounter (Signed)
I relayed to pt that refills sent in for her.  She appreciated this will call back if questions or concerns.

## 2020-11-13 NOTE — Patient Instructions (Incomplete)
Below is our plan:  We will ***  Please make sure you are staying well hydrated. I recommend 50-60 ounces daily. Well balanced diet and regular exercise encouraged. Consistent sleep schedule with 6-8 hours recommended.   Please continue follow up with care team as directed.   Follow up with *** in ***  You may receive a survey regarding today's visit. I encourage you to leave honest feed back as I do use this information to improve patient care. Thank you for seeing me today!      Migraine with aura: There is increased risk for stroke in women with migraine with aura and a contraindication for the combined contraceptive pill for use by women who have migraine with aura. The risk for women with migraine without aura is lower. However other risk factors like smoking are far more likely to increase stroke risk than migraine. There is a recommendation for no smoking and for the use of OCPs without estrogen such as progestogen only pills particularly for women with migraine with aura.Pamela Wells People who have migraine headaches with auras may be 3 times more likely to have a stroke caused by a blood clot, compared to migraine patients who don't see auras. Women who take hormone-replacement therapy may be 30 percent more likely to suffer a clot-based stroke than women not taking medication containing estrogen. Other risk factors like smoking and high blood pressure may be  much more important.   Magnesium: may help with headaches are aura, the best evidence for magnesium is for migraine with aura is its thought to stop the cortical spreading depression we believe is the pathophysiology of migraine aura.  To prevent or relieve headaches, try the following:  Cool Compress. Lie down and place a cool compress on your head.   Avoid headache triggers. If certain foods or odors seem to have triggered your migraines in the past, avoid them. A headache diary might help you identify triggers.   Include physical  activity in your daily routine. Try a daily walk or other moderate aerobic exercise.   Manage stress. Find healthy ways to cope with the stressors, such as delegating tasks on your to-do list.   Practice relaxation techniques. Try deep breathing, yoga, massage and visualization.   Eat regularly. Eating regularly scheduled meals and maintaining a healthy diet might help prevent headaches. Also, drink plenty of fluids.   Follow a regular sleep schedule. Sleep deprivation might contribute to headaches  Consider biofeedback. With this mind-body technique, you learn to control certain bodily functions - such as muscle tension, heart rate and blood pressure - to prevent headaches or reduce headache pain.      Proceed to emergency room if you experience new or worsening symptoms or symptoms do not resolve, if you have new neurologic symptoms or if headache is severe, or for any concerning symptom.   Migraine Headache A migraine headache is a very strong throbbing pain on one side or both sides of your head. This type of headache can also cause other symptoms. It can last from 4 hours to 3 days. Talk with your doctor about what things may bring on (trigger) this condition. What are the causes? The exact cause of this condition is not known. This condition may be triggered or caused by:  Drinking alcohol.  Smoking.  Taking medicines, such as: ? Medicine used to treat chest pain (nitroglycerin). ? Birth control pills. ? Estrogen. ? Some blood pressure medicines.  Eating or drinking certain products.  Doing physical  activity. Other things that may trigger a migraine headache include:  Having a menstrual period.  Pregnancy.  Hunger.  Stress.  Not getting enough sleep or getting too much sleep.  Weather changes.  Tiredness (fatigue). What increases the risk?  Being 25-55 years old.  Being female.  Having a family history of migraine headaches.  Being Caucasian.  Having  depression or anxiety.  Being very overweight. What are the signs or symptoms?  A throbbing pain. This pain may: ? Happen in any area of the head, such as on one side or both sides. ? Make it hard to do daily activities. ? Get worse with physical activity. ? Get worse around bright lights or loud noises.  Other symptoms may include: ? Feeling sick to your stomach (nauseous). ? Vomiting. ? Dizziness. ? Being sensitive to bright lights, loud noises, or smells.  Before you get a migraine headache, you may get warning signs (an aura). An aura may include: ? Seeing flashing lights or having blind spots. ? Seeing bright spots, halos, or zigzag lines. ? Having tunnel vision or blurred vision. ? Having numbness or a tingling feeling. ? Having trouble talking. ? Having weak muscles.  Some people have symptoms after a migraine headache (postdromal phase), such as: ? Tiredness. ? Trouble thinking (concentrating). How is this treated?  Taking medicines that: ? Relieve pain. ? Relieve the feeling of being sick to your stomach. ? Prevent migraine headaches.  Treatment may also include: ? Having acupuncture. ? Avoiding foods that bring on migraine headaches. ? Learning ways to control your body functions (biofeedback). ? Therapy to help you know and deal with negative thoughts (cognitive behavioral therapy). Follow these instructions at home: Medicines  Take over-the-counter and prescription medicines only as told by your doctor.  Ask your doctor if the medicine prescribed to you: ? Requires you to avoid driving or using heavy machinery. ? Can cause trouble pooping (constipation). You may need to take these steps to prevent or treat trouble pooping:  Drink enough fluid to keep your pee (urine) pale yellow.  Take over-the-counter or prescription medicines.  Eat foods that are high in fiber. These include beans, whole grains, and fresh fruits and vegetables.  Limit foods that  are high in fat and sugar. These include fried or sweet foods. Lifestyle  Do not drink alcohol.  Do not use any products that contain nicotine or tobacco, such as cigarettes, e-cigarettes, and chewing tobacco. If you need help quitting, ask your doctor.  Get at least 8 hours of sleep every night.  Limit and deal with stress. General instructions  Keep a journal to find out what may bring on your migraine headaches. For example, write down: ? What you eat and drink. ? How much sleep you get. ? Any change in what you eat or drink. ? Any change in your medicines.  If you have a migraine headache: ? Avoid things that make your symptoms worse, such as bright lights. ? It may help to lie down in a dark, quiet room. ? Do not drive or use heavy machinery. ? Ask your doctor what activities are safe for you.  Keep all follow-up visits as told by your doctor. This is important.      Contact a doctor if:  You get a migraine headache that is different or worse than others you have had.  You have more than 15 headache days in one month. Get help right away if:  Your migraine headache gets very   bad.  Your migraine headache lasts longer than 72 hours.  You have a fever.  You have a stiff neck.  You have trouble seeing.  Your muscles feel weak or like you cannot control them.  You start to lose your balance a lot.  You start to have trouble walking.  You pass out (faint).  You have a seizure. Summary  A migraine headache is a very strong throbbing pain on one side or both sides of your head. These headaches can also cause other symptoms.  This condition may be treated with medicines and changes to your lifestyle.  Keep a journal to find out what may bring on your migraine headaches.  Contact a doctor if you get a migraine headache that is different or worse than others you have had.  Contact your doctor if you have more than 15 headache days in a month. This information  is not intended to replace advice given to you by your health care provider. Make sure you discuss any questions you have with your health care provider. Document Revised: 10/15/2018 Document Reviewed: 08/05/2018 Elsevier Patient Education  2021 [employer] Inc.  

## 2021-01-01 ENCOUNTER — Encounter: Payer: Medicaid Other | Admitting: Nurse Practitioner

## 2021-03-18 ENCOUNTER — Other Ambulatory Visit: Payer: Self-pay | Admitting: Nurse Practitioner

## 2021-03-18 ENCOUNTER — Encounter: Payer: Self-pay | Admitting: Nurse Practitioner

## 2021-03-18 ENCOUNTER — Ambulatory Visit (INDEPENDENT_AMBULATORY_CARE_PROVIDER_SITE_OTHER): Payer: Medicaid Other | Admitting: Nurse Practitioner

## 2021-03-18 ENCOUNTER — Other Ambulatory Visit: Payer: Self-pay

## 2021-03-18 VITALS — BP 115/77 | HR 64 | Temp 98.3°F | Ht 64.0 in | Wt 203.0 lb

## 2021-03-18 DIAGNOSIS — Z0001 Encounter for general adult medical examination with abnormal findings: Secondary | ICD-10-CM

## 2021-03-18 DIAGNOSIS — G43109 Migraine with aura, not intractable, without status migrainosus: Secondary | ICD-10-CM

## 2021-03-18 DIAGNOSIS — J3089 Other allergic rhinitis: Secondary | ICD-10-CM | POA: Diagnosis not present

## 2021-03-18 DIAGNOSIS — S060X9A Concussion with loss of consciousness of unspecified duration, initial encounter: Secondary | ICD-10-CM | POA: Insufficient documentation

## 2021-03-18 DIAGNOSIS — J302 Other seasonal allergic rhinitis: Secondary | ICD-10-CM

## 2021-03-18 DIAGNOSIS — Z139 Encounter for screening, unspecified: Secondary | ICD-10-CM

## 2021-03-18 DIAGNOSIS — S060X0D Concussion without loss of consciousness, subsequent encounter: Secondary | ICD-10-CM

## 2021-03-18 DIAGNOSIS — Z23 Encounter for immunization: Secondary | ICD-10-CM | POA: Diagnosis not present

## 2021-03-18 DIAGNOSIS — R195 Other fecal abnormalities: Secondary | ICD-10-CM

## 2021-03-18 DIAGNOSIS — S060XAA Concussion with loss of consciousness status unknown, initial encounter: Secondary | ICD-10-CM | POA: Insufficient documentation

## 2021-03-18 MED ORDER — CETIRIZINE HCL 10 MG PO TABS: 10.0000 mg | ORAL_TABLET | Freq: Every day | ORAL | 11 refills | Status: DC

## 2021-03-18 NOTE — Patient Instructions (Signed)
Please have fasting labs drawn today. 

## 2021-03-18 NOTE — Assessment & Plan Note (Signed)
-  she states she fell in shower several weeks ago -she was having light/sound sensitivity and slurred speech 2 weeks ago, that improved some last week and is much better this week -we discussed referral to sports medicine/concussion clinic, but she is not interested at this time since she is improving

## 2021-03-18 NOTE — Assessment & Plan Note (Addendum)
-  Rx. Zyrtec -she states that she saw allergist previously and was taking allergy shots, but these were discontinued when she went to [place] d/t issues with care coordination -referral to allergist

## 2021-03-18 NOTE — Progress Notes (Signed)
Established Patient Office Visit  Subjective:  Patient ID: Pamela Wells, female    DOB: 2000/09/26  Age: 20 y.o. MRN: [id number]  CC:  Chief Complaint  Patient presents with   Annual Exam    CPE. Has recently fell and had a concussion, 2 weeks ago. Still having intermittent nausea.     HPI [name] presents for physical exam.  She had recent PAP with GYN.  She fell in shower and had a concussion. She is feeling better, but states she still has some nausea and has been taking zofran.  Past Medical History:  Diagnosis Date   Abdominal pain, RUQ (right upper quadrant) 09/03/2017   ASCUS of cervix with negative high risk HPV 08/09/2020   Repeat in 1 year.   Asthma    no inhaler use in the past 2 months. 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   Urticaria     Past Surgical History:  Procedure Laterality Date   ESOPHAGOGASTRODUODENOSCOPY N/A 09/25/2017   Procedure: ESOPHAGOGASTRODUODENOSCOPY (EGD);  Surgeon: Danie Binder, MD;  Location: [facility];  Service: Endoscopy;  Laterality: N/A;  9:30am   TONGUE SURGERY     HAD FRENULUM CLIPPED    Family History  Problem Relation Age of Onset   Hypertension Father    Diabetes Father    Ovarian cancer Maternal Grandmother    Stroke Maternal Grandfather    Colon cancer Neg Hx    Colon polyps Neg Hx     Social History   Socioeconomic History   Marital status: Single    Spouse name: Not on file   Number of children: 1   Years of education: Not on file   Highest education  level: Some college, no degree  Occupational History    Comment: Optometrist, [place]  Tobacco Use   Smoking status: Never   Smokeless tobacco: Never  Vaping Use   Vaping Use: Never used  Substance and Sexual Activity   Alcohol use: Yes    Comment: rarely   Drug use: No   Sexual activity: Yes    Birth control/protection: Condom, Pill  Other Topics Concern   Not on file  Social History Narrative   In college, [place]    Eats all food groups.   Wears seatbelt.    Drives a car.    Does not smoke.    Reports that is religious and believes in God.   Reports she is not sexually active.  Denies any tobacco, alcohol, or drug use.   Social Determinants of Health   Financial Resource Strain: Unknown   Difficulty of Paying Living Expenses: Patient refused  Food Insecurity: Landscape architect Present   Worried About Charity fundraiser in the Last Year: Sometimes true   Ran Out of Food in the Last Year: Sometimes true  Transportation Needs: No Transportation Needs   Lack of Transportation (Medical): No   Lack of Transportation (Non-Medical): No  Physical Activity: Sufficiently Active   Days of Exercise per Week:  5 days   Minutes of Exercise per Session: 30 min  Stress: Stress Concern Present   Feeling of Stress : To some extent  Social Connections: Socially Isolated   Frequency of Communication with Friends and Family: More than three times a week   Frequency of Social Gatherings with Friends and Family: More than three times a week   Attends Religious Services: Never   Active Member of Clubs or Organizations: No   Attends Club or Organization Meetings: Never   Marital Status: Never married  Intimate Partner Violence: Not At Risk   Fear of Current or Ex-Partner: No   Emotionally Abused: No   Physically Abused: No   Sexually Abused: No    Outpatient Medications Prior to Visit  Medication Sig Dispense Refill   albuterol (VENTOLIN HFA) 108 (90 Base) MCG/ACT inhaler Inhale 1-2  puffs into the lungs every 6 (six) hours as needed for wheezing or shortness of breath. 18 g 2   ibuprofen (ADVIL) 800 MG tablet Take by mouth.     norethindrone (MICRONOR) 0.35 MG tablet Take 1 tablet (0.35 mg total) by mouth daily. 84 tablet 4   ondansetron (ZOFRAN-ODT) 4 MG disintegrating tablet Take 1 tablet (4 mg total) by mouth every 8 (eight) hours as needed for nausea or vomiting. 15 tablet 0   amitriptyline (ELAVIL) 10 MG tablet Take 1 tablet (10 mg total) by mouth at bedtime. (Patient not taking: Reported on 03/18/2021) 90 tablet 0   rizatriptan (MAXALT-MLT) 10 MG disintegrating tablet Take 1 tablet (10 mg total) by mouth as needed for migraine. May repeat in 2 hours if needed (Patient not taking: Reported on 03/18/2021) 9 tablet 2   topiramate (TOPAMAX) 100 MG tablet Take 1 tablet (100 mg total) by mouth 2 (two) times daily. (Patient not taking: Reported on 03/18/2021) 180 tablet 0   montelukast (SINGULAIR) 10 MG tablet Take 1 tablet (10 mg total) by mouth at bedtime. (Patient not taking: No sig reported) 90 tablet 3   omeprazole (PRILOSEC) 40 MG capsule Take 1 capsule (40 mg total) by mouth daily. (Patient not taking: Reported on 03/18/2021) 30 capsule 1   No facility-administered medications prior to visit.    Allergies  Allergen Reactions   Lactose Intolerance (Gi)    Peanut-Containing Drug Products Hives   Sesame Oil    Mixed Grasses    Pollen Extract     ROS Review of Systems  Constitutional: Negative.   HENT: Negative.    Eyes: Negative.   Respiratory: Negative.    Cardiovascular: Negative.   Gastrointestinal: Negative.   Endocrine: Negative.   Genitourinary: Negative.   Musculoskeletal: Negative.   Skin: Negative.   Allergic/Immunologic: Negative.   Neurological: Negative.   Hematological: Negative.   Psychiatric/Behavioral: Negative.       Objective:    Physical Exam Constitutional:      Appearance: Normal appearance. She is obese.  HENT:     Head:  Normocephalic and atraumatic.     Right Ear: Tympanic membrane, ear canal and external ear normal.     Left Ear: Tympanic membrane, ear canal and external ear normal.     Nose: Nose normal.     Mouth/Throat:     Mouth: Mucous membranes are moist.     Pharynx: Oropharynx is clear.  Eyes:     Extraocular Movements: Extraocular movements intact.     Conjunctiva/sclera: Conjunctivae normal.     Pupils: Pupils are equal, round, and reactive to light.  Cardiovascular:       Rate and Rhythm: Normal rate and regular rhythm.     Pulses: Normal pulses.     Heart sounds: Normal heart sounds.  Pulmonary:     Effort: Pulmonary effort is normal.     Breath sounds: Normal breath sounds.  Abdominal:     General: Abdomen is flat. Bowel sounds are normal.     Palpations: Abdomen is soft.  Musculoskeletal:        General: Normal range of motion.     Cervical back: Normal range of motion and neck supple.  Skin:    General: Skin is warm and dry.     Capillary Refill: Capillary refill takes less than 2 seconds.  Neurological:     General: No focal deficit present.     Mental Status: She is alert and oriented to person, place, and time.     Cranial Nerves: No cranial nerve deficit.     Sensory: No sensory deficit.     Motor: No weakness.     Coordination: Coordination normal.     Gait: Gait normal.  Psychiatric:        Mood and Affect: Mood normal.        Behavior: Behavior normal.        Thought Content: Thought content normal.        Judgment: Judgment normal.    BP 115/77 (BP Location: Left Arm, Patient Position: Sitting, Cuff Size: Large)   Pulse 64   Temp 98.3 F (36.8 C) (Oral)   Ht 5' 4" (1.626 m)   Wt 203 lb (92.1 kg)   LMP 03/03/2021 (Exact Date)   SpO2 98%   BMI 34.84 kg/m  Wt Readings from Last 3 Encounters:  03/18/21 203 lb (92.1 kg)  10/22/20 209 lb (94.8 kg)  08/03/20 209 lb 8 oz (95 kg)     Health Maintenance Due  Topic Date Due   INFLUENZA VACCINE  02/04/2021     There are no preventive care reminders to display for this patient.  Lab Results  Component Value Date   TSH 0.65 07/17/2017   Lab Results  Component Value Date   WBC 6.3 01/10/2019   HGB 12.8 01/10/2019   HCT 38.4 01/10/2019   MCV 92.1 01/10/2019   PLT 226 01/10/2019   Lab Results  Component Value Date   NA 138 01/10/2019   K 3.7 01/10/2019   CO2 23 01/10/2019   GLUCOSE 110 (H) 01/10/2019   BUN 16 01/10/2019   CREATININE 0.65 01/10/2019   BILITOT 0.7 07/17/2017   AST 17 07/17/2017   ALT 12 07/17/2017   PROT 7.4 07/17/2017   CALCIUM 9.0 01/10/2019   ANIONGAP 11 01/10/2019   No results found for: CHOL No results found for: HDL No results found for: LDLCALC No results found for: TRIG No results found for: CHOLHDL Lab Results  Component Value Date   HGBA1C 5.3 02/03/2019      Assessment & Plan:   Problem List Items Addressed This Visit       Cardiovascular and Mediastinum   Migraine with aura and without status migrainosus, not intractable    -she hasn't had her medications recently, and she is requesting neuro referral -referral to neuro      Relevant Orders   Ambulatory referral to Neurology     Respiratory   Seasonal and perennial allergic rhinitis    -Rx. Zyrtec -she states that she saw allergist previously and was taking allergy shots, but these were discontinued when she went to   [place] d/t issues with care coordination -referral to allergist      Relevant Medications   cetirizine (ZYRTEC) 10 MG tablet   Other Relevant Orders   Ambulatory referral to Allergy     Other   Concussion    -she states she fell in shower several weeks ago -she was having light/sound sensitivity and slurred speech 2 weeks ago, that improved some last week and is much better this week -we discussed referral to sports medicine/concussion clinic, but she is not interested at this time since she is improving      Other Visit Diagnoses     Encounter for general  adult medical examination with abnormal findings    -  Primary   Relevant Orders   CBC with Differential/Platelet   Lipid Panel With LDL/HDL Ratio   CMP14+EGFR   Hepatitis C antibody   HIV Antibody (routine testing w rflx)   Screening due       Relevant Orders   Hepatitis C antibody   HIV Antibody (routine testing w rflx)       Meds ordered this encounter  Medications   cetirizine (ZYRTEC) 10 MG tablet    Sig: Take 1 tablet (10 mg total) by mouth daily.    Dispense:  30 tablet    Refill:  11    Follow-up: Return in about 1 year (around 03/18/2022) for Physical Exam.    JOSEPH M GRAY, NP 

## 2021-03-18 NOTE — Assessment & Plan Note (Signed)
-  she hasn't had her medications recently, and she is requesting neuro referral -referral to neuro

## 2021-03-19 LAB — CBC WITH DIFFERENTIAL/PLATELET
Basophils Absolute: 0 10*3/uL (ref 0.0–0.2)
Basos: 0 %
EOS (ABSOLUTE): 0.1 10*3/uL (ref 0.0–0.4)
Eos: 2 %
Hematocrit: 43.2 % (ref 34.0–46.6)
Hemoglobin: 14.2 g/dL (ref 11.1–15.9)
Immature Grans (Abs): 0 10*3/uL (ref 0.0–0.1)
Immature Granulocytes: 0 %
Lymphocytes Absolute: 2.1 10*3/uL (ref 0.7–3.1)
Lymphs: 30 %
MCH: 29.4 pg (ref 26.6–33.0)
MCHC: 32.9 g/dL (ref 31.5–35.7)
MCV: 89 fL (ref 79–97)
Monocytes Absolute: 0.5 10*3/uL (ref 0.1–0.9)
Monocytes: 7 %
Neutrophils Absolute: 4.2 10*3/uL (ref 1.4–7.0)
Neutrophils: 61 %
Platelets: 255 10*3/uL (ref 150–450)
RBC: 4.83 x10E6/uL (ref 3.77–5.28)
RDW: 12.2 % (ref 11.7–15.4)
WBC: 6.9 10*3/uL (ref 3.4–10.8)

## 2021-03-19 LAB — CMP14+EGFR
ALT: 14 IU/L (ref 0–32)
AST: 21 IU/L (ref 0–40)
Albumin/Globulin Ratio: 1.5 (ref 1.2–2.2)
Albumin: 4.6 g/dL (ref 3.9–5.0)
Alkaline Phosphatase: 82 IU/L (ref 44–121)
BUN/Creatinine Ratio: 9 (ref 9–23)
BUN: 7 mg/dL (ref 6–20)
Bilirubin Total: 0.4 mg/dL (ref 0.0–1.2)
CO2: 23 mmol/L (ref 20–29)
Calcium: 9.4 mg/dL (ref 8.7–10.2)
Chloride: 99 mmol/L (ref 96–106)
Creatinine, Ser: 0.75 mg/dL (ref 0.57–1.00)
Globulin, Total: 3 g/dL (ref 1.5–4.5)
Glucose: 99 mg/dL (ref 65–99)
Potassium: 4.2 mmol/L (ref 3.5–5.2)
Sodium: 139 mmol/L (ref 134–144)
Total Protein: 7.6 g/dL (ref 6.0–8.5)
eGFR: 116 mL/min/{1.73_m2} (ref >59)

## 2021-03-19 LAB — HIV ANTIBODY (ROUTINE TESTING W REFLEX): HIV Screen 4th Generation wRfx: NONREACTIVE

## 2021-03-19 LAB — LIPID PANEL WITH LDL/HDL RATIO
Cholesterol, Total: 198 mg/dL (ref 100–199)
HDL: 55 mg/dL (ref >39)
LDL Chol Calc (NIH): 124 mg/dL — ABNORMAL HIGH (ref 0–99)
LDL/HDL Ratio: 2.3 ratio (ref 0.0–3.2)
Triglycerides: 107 mg/dL (ref 0–149)
VLDL Cholesterol Cal: 19 mg/dL (ref 5–40)

## 2021-03-19 LAB — HEPATITIS C ANTIBODY: Hep C Virus Ab: <0.1 s/co ratio (ref 0.0–0.9)

## 2021-03-19 NOTE — Progress Notes (Signed)
Bad cholesterol is slightly elevated, so try cutting back on fried/fatty foods.

## 2021-03-26 ENCOUNTER — Encounter: Payer: Self-pay | Admitting: Nurse Practitioner

## 2021-03-26 ENCOUNTER — Ambulatory Visit: Payer: Medicaid Other | Admitting: Nurse Practitioner

## 2021-03-26 ENCOUNTER — Other Ambulatory Visit: Payer: Self-pay

## 2021-03-26 DIAGNOSIS — R490 Dysphonia: Secondary | ICD-10-CM

## 2021-03-26 MED ORDER — AMOXICILLIN-POT CLAVULANATE 875-125 MG PO TABS: 1.0000 | ORAL_TABLET | Freq: Two times a day (BID) | ORAL | 0 refills | Status: DC

## 2021-03-26 MED ORDER — PREDNISONE 20 MG PO TABS: 40.0000 mg | ORAL_TABLET | Freq: Every day | ORAL | 0 refills | Status: DC

## 2021-03-26 NOTE — Patient Instructions (Signed)
If SOB or lip swelling develop, go directly to the emergency department or call [phone].

## 2021-03-26 NOTE — Assessment & Plan Note (Addendum)
-  she states this is the 5th or 6th time this has happened in the last 2 months -Rx. augmentin and prednisone -she has allergist appointment upcoming, but unsure if this is allergic in nature since she has no lip swelling, rash, itching, etc. -referral to ENT -she states she gets swollen lymph nodes and tonsils when this happens usually, but not today

## 2021-03-26 NOTE — Progress Notes (Signed)
Acute Office Visit  Subjective:    Patient ID: Pamela Wells, female    DOB: 11/14/2000, 20 y.o.   MRN: [id number]  Chief Complaint  Patient presents with   Hoarse    Started this am around 9-10am. This is the 6th episode in the past 2 months and thinks it could be a reaction of some type. Was just referred to Allergy and Asthma a few days ago, has not heard anything yet.     HPI Patient is in today for hoarseness and   Past Medical History:  Diagnosis Date   Abdominal pain, RUQ (right upper quadrant) 09/03/2017   ASCUS of cervix with negative high risk HPV 08/09/2020   Repeat in 1 year.   Asthma    no inhaler use in the past 2 months. 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   Urticaria     Past Surgical History:  Procedure Laterality Date   ESOPHAGOGASTRODUODENOSCOPY N/A 09/25/2017   Procedure: ESOPHAGOGASTRODUODENOSCOPY (EGD);  Surgeon: Danie Binder, MD;  Location: [facility];  Service: Endoscopy;  Laterality: N/A;  9:30am   TONGUE SURGERY     HAD FRENULUM CLIPPED    Family History  Problem Relation Age of Onset   Hypertension Father    Diabetes Father    Ovarian cancer Maternal Grandmother    Stroke Maternal Grandfather    Colon cancer Neg Hx    Colon polyps Neg Hx     Social History   Socioeconomic History   Marital status: Single    Spouse name: Not on file   Number of children: 1   Years of education: Not on file   Highest education level: Some college, no degree  Occupational History    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   Wears seatbelt.    Drives a car.    Does not smoke.    Reports that is religious and believes in God.   Reports she is not sexually active.  Denies any tobacco, alcohol, or drug use.   Social Determinants of Health   Financial Resource Strain: Unknown   Difficulty of Paying Living Expenses: Patient refused  Food Insecurity: Landscape architect Present   Worried About Charity fundraiser in the Last Year: Sometimes true   Ran Out of Food in the Last Year: Sometimes true  Transportation Needs: No Transportation Needs   Lack of Transportation (Medical): No   Lack of Transportation (Non-Medical): No  Physical Activity: Sufficiently Active   Days of Exercise per Week: 5 days   Minutes of  Exercise per Session: 30 min  Stress: Stress Concern Present   Feeling of Stress : To some extent  Social Connections: Socially Isolated   Frequency of Communication with Friends and Family: More than three times a week   Frequency of Social Gatherings with Friends and Family: More than three times a week   Attends Religious Services: Never   Marine scientist or Organizations: No   Attends Music therapist: Never   Marital Status: Never married  Human resources officer Violence: Not At Risk   Fear of Current or Ex-Partner: No   Emotionally Abused: No   Physically Abused: No   Sexually Abused: No    Outpatient Medications Prior to Visit  Medication Sig Dispense Refill   albuterol (VENTOLIN HFA) 108 (90 Base) MCG/ACT inhaler Inhale 1-2 puffs into the lungs every 6 (six) hours as needed for wheezing or shortness  of breath. 18 g 2   amitriptyline (ELAVIL) 10 MG tablet Take 1 tablet (10 mg total) by mouth at bedtime. 90 tablet 0   cetirizine (ZYRTEC) 10 MG tablet Take 1 tablet (10 mg total) by mouth daily. 30 tablet 11   ibuprofen (ADVIL) 800 MG tablet Take by mouth.     norethindrone (MICRONOR) 0.35 MG tablet Take 1 tablet (0.35 mg total) by mouth daily. 84 tablet 4   ondansetron (ZOFRAN-ODT) 4 MG disintegrating tablet Take 1 tablet (4 mg total) by mouth every 8 (eight) hours as needed for nausea or vomiting. 15 tablet 0   rizatriptan (MAXALT-MLT) 10 MG disintegrating tablet Take 1 tablet (10 mg total) by mouth as needed for migraine. May repeat in 2 hours if needed 9 tablet 2   topiramate (TOPAMAX) 100 MG tablet Take 1 tablet (100 mg total) by mouth 2 (two) times daily. 180 tablet 0   No facility-administered medications prior to visit.    Allergies  Allergen Reactions   Lactose Intolerance (Gi)    Peanut-Containing Drug Products Hives   Sesame Oil    Mixed Grasses    Pollen Extract     Review of Systems  Constitutional:  Positive for fever. Negative for chills and fatigue.  HENT:  Positive for sore throat.        Hoarse  Respiratory: Negative.    Skin: Negative.       Objective:    Physical Exam  LMP 03/03/2021 (Exact Date)  Wt Readings from Last 3 Encounters:  03/18/21 203 lb (92.1 kg)  10/22/20 209 lb (94.8 kg)  08/03/20 209 lb 8 oz (95 kg)    There are no preventive care reminders to display for this patient.  There are no preventive care reminders to display for this patient.   Lab Results  Component Value Date   TSH 0.65 07/17/2017   Lab Results  Component Value Date   WBC 6.9 03/18/2021   HGB 14.2 03/18/2021   HCT 43.2 03/18/2021   MCV 89 03/18/2021   PLT 255 03/18/2021   Lab Results  Component Value Date   NA 139 03/18/2021   K 4.2 03/18/2021   CO2 23 03/18/2021   GLUCOSE 99 03/18/2021   BUN 7 03/18/2021   CREATININE 0.75 03/18/2021   BILITOT 0.4  03/18/2021   ALKPHOS 82 03/18/2021   AST 21 03/18/2021   ALT 14 03/18/2021   PROT 7.6 03/18/2021   ALBUMIN 4.6 03/18/2021   CALCIUM 9.4 03/18/2021   ANIONGAP 11 01/10/2019   EGFR 116 03/18/2021   Lab Results  Component Value Date  CHOL 198 03/18/2021   Lab Results  Component Value Date   HDL 55 03/18/2021   Lab Results  Component Value Date   LDLCALC 124 (H) 03/18/2021   Lab Results  Component Value Date   TRIG 107 03/18/2021   No results found for: [facility] Lab Results  Component Value Date   HGBA1C 5.3 02/03/2019       Assessment & Plan:   Problem List Items Addressed This Visit       Other   Hoarse - Primary    -she states this is the 5th or 6th time this has happened in the last 2 months -Rx. augmentin and prednisone -she has allergist appointment upcoming, but unsure if this is allergic in nature since she has no lip swelling, rash, itching, etc. -referral to ENT -she states she gets swollen lymph nodes and tonsils when this happens usually, but not today      Relevant Medications   amoxicillin-clavulanate (AUGMENTIN) 875-125 MG tablet   predniSONE (DELTASONE) 20 MG tablet   Other Relevant Orders   Ambulatory referral to ENT     Meds ordered this encounter  Medications   amoxicillin-clavulanate (AUGMENTIN) 875-125 MG tablet    Sig: Take 1 tablet by mouth 2 (two) times daily.    Dispense:  14 tablet    Refill:  0   predniSONE (DELTASONE) 20 MG tablet    Sig: Take 2 tablets (40 mg total) by mouth daily with breakfast.    Dispense:  10 tablet    Refill:  0    Date:  03/26/2021   Location of Patient: Home Location of Provider: Office Consent was obtain for visit to be over via telehealth. I verified that I am speaking with the correct person using two identifiers.  I connected with  Susa Raring on 03/26/21 via telephone and verified that I am speaking with the correct person using two identifiers.   I discussed the limitations of  evaluation and management by telemedicine. The patient expressed understanding and agreed to proceed.  Time spent:9 min     Noreene Larsson, NP

## 2021-05-22 ENCOUNTER — Ambulatory Visit (INDEPENDENT_AMBULATORY_CARE_PROVIDER_SITE_OTHER): Payer: Medicaid Other | Admitting: Allergy & Immunology

## 2021-05-22 ENCOUNTER — Other Ambulatory Visit: Payer: Self-pay

## 2021-05-22 VITALS — BP 118/70 | HR 64 | Temp 98.7°F | Resp 16 | Ht 64.0 in | Wt 194.6 lb

## 2021-05-22 DIAGNOSIS — K219 Gastro-esophageal reflux disease without esophagitis: Secondary | ICD-10-CM | POA: Diagnosis not present

## 2021-05-22 DIAGNOSIS — J4540 Moderate persistent asthma, uncomplicated: Secondary | ICD-10-CM | POA: Diagnosis not present

## 2021-05-22 DIAGNOSIS — J302 Other seasonal allergic rhinitis: Secondary | ICD-10-CM

## 2021-05-22 DIAGNOSIS — T7800XD Anaphylactic reaction due to unspecified food, subsequent encounter: Secondary | ICD-10-CM | POA: Diagnosis not present

## 2021-05-22 DIAGNOSIS — J3089 Other allergic rhinitis: Secondary | ICD-10-CM

## 2021-05-22 MED ORDER — MONTELUKAST SODIUM 10 MG PO TABS: 10.0000 mg | ORAL_TABLET | Freq: Every day | ORAL | 3 refills | Status: DC

## 2021-05-22 MED ORDER — CETIRIZINE HCL 10 MG PO TABS: 10.0000 mg | ORAL_TABLET | Freq: Every day | ORAL | 3 refills | Status: DC

## 2021-05-22 MED ORDER — OMEPRAZOLE 20 MG PO CPDR: 20.0000 mg | DELAYED_RELEASE_CAPSULE | Freq: Every day | ORAL | 3 refills | Status: DC

## 2021-05-22 MED ORDER — FLUTICASONE FUROATE-VILANTEROL 200-25 MCG/ACT IN AEPB: 1.0000 | INHALATION_SPRAY | Freq: Every day | RESPIRATORY_TRACT | 5 refills | Status: DC

## 2021-05-22 MED ORDER — ALBUTEROL SULFATE HFA 108 (90 BASE) MCG/ACT IN AERS: 1.0000 | INHALATION_SPRAY | Freq: Four times a day (QID) | RESPIRATORY_TRACT | 2 refills | Status: AC | PRN

## 2021-05-22 MED ORDER — EPINEPHRINE 0.3 MG/0.3ML IJ SOAJ: 0.3000 mg | Freq: Once | INTRAMUSCULAR | 2 refills | Status: AC

## 2021-05-22 NOTE — Progress Notes (Signed)
FOLLOW UP  Date of Service/Encounter:  05/22/21   Assessment:   Moderate persistent asthma, uncomplicated     Seasonal and perennial allergic rhinitis (trees, weeds, grasses, indoor molds, outdoor molds, dust mites, cat and cockroach) - wishing to restart allergen immunotherapy in the spring 2023   Adverse food reaction (peanut, sesame)   Lactose intolerance     Plan/Recommendations:   2. Moderate persistent asthma, uncomplicated - Lung testing looks awesome.  - We will send in refills on all of your medications.  - Daily controller medication(s): Singulair 10mg  daily and Breo 200/79mcg one puff once daily - Prior to physical activity: ProAir 2 puffs 10-15 minutes before physical activity. - Rescue medications: ProAir 4 puffs every 4-6 hours as needed - Asthma control goals:  * Full participation in all desired activities (may need albuterol before activity) * Albuterol use two time or less a week on average (not counting use with activity) * Cough interfering with sleep two time or less a month * Oral steroids no more than once a year * No hospitalizations   3. Chronic rhinitis (trees, weeds, grasses, indoor molds, outdoor molds, dust mites, cat and cockroach) - We will plan to restart allergy shots next spring when you return from college.  - Continue with: Zyrtec (cetirizine) 10mg  tablet once daily, Singulair (montelukast) 10mg  daily  4. Adverse food reaction (peanuts, sesame) - Epinephrine is up to date. - Continue to avoid these foods.   4. GERD - Add on omeprazole 20 mg daily.  5. Return in about 3 months or earlier if needed.   Subjective:   Pamela Wells is a 20 y.o. female presenting today for follow up of  Chief Complaint  Patient presents with   Breathing Problem    Patient in today to reestablish care for her asthma. She reports that she has had a couple of flares and does not have an inhaler. Her asthma and allergies are worse lately. She is in  college in the [employer] [name] at [employer].    Pamela Wells has a history of the following: Patient Active Problem List   Diagnosis Date Noted   Hoarse 03/26/2021   Concussion 03/18/2021   Encounter for surveillance of contraceptive pills 11/01/2020   GERD (gastroesophageal reflux disease) 10/22/2020   ASCUS of cervix with negative high risk HPV 08/09/2020   Encounter for initial prescription of contraceptive pills 08/03/2020   Irregular periods 08/03/2020   Routine medical exam 08/03/2020   Acute midline thoracic back pain 12/22/2019   Annual visit for general adult medical examination with abnormal findings 12/22/2019   Pregnancy examination or test, negative result 09/26/2019   Chronic sinus complaints 07/12/2019   Flat feet, bilateral 07/12/2019   Migraine with aura and without status migrainosus, not intractable 09/22/2018   Moderate persistent asthma without complication 12/22/2017   Seasonal and perennial allergic rhinitis 12/22/2017   Dysphagia 09/03/2017    History obtained from: chart review and patient.  Pamela Wells is a 20 y.o. female presenting for a follow up visit.  She was last seen in May 2020.  At that time, we ended up treating her with Augmentin for sinusitis.  We referred her to ENT for an evaluation.  For her asthma, we recommended taking her Breo 200/25 mcg 1 puff once daily as well as albuterol as needed.  For her allergic rhinitis, she wanted to restart her allergen immunotherapy.  We stopped her Nasacort and started [place] instead.  We continue with Zyrtec as well as  Singulair.  We recommended continued avoidance of peanuts and sesame.  Since the last visit, she has largely done well.   Asthma/Respiratory Symptom History: She had a problem with her asthma over the summer. She reports that she had difficulty breathing. Before it became too severe, she managed to calm it down and it seemed like it worked. She was trying to do breathing exercises. She was on Breo  and she had an emergency inhaler. She has not had any of that.   Allergic Rhinitis Symptom History: She was on the allergy shots.  She is interested in restarting the allergy shots, but she is going to wait until next semester. She is going to graduate in May and then move back here.   Food Allergy Symptom History: She continues to avoid peanuts and sesame.  She doesn ot have an EpiPen. She has been without these for two years.  GERD Symptom History: She has never non GERD medication. She started having issues with it  It is rather random overall.   She is majoring in [employer] with a minor in Psychology. 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     Otherwise, there have been no changes to her past medical history, surgical history, family history, or social history.    Review of Systems  Constitutional:  Negative for chills, fever, malaise/fatigue and weight loss.  HENT:  Positive for congestion. Negative for ear discharge and ear pain.   Eyes:  Negative for pain, discharge and redness.  Respiratory:  Positive for cough and wheezing. Negative for sputum production and shortness of breath.   Cardiovascular: Negative.  Negative for chest pain and palpitations.  Gastrointestinal:  Negative for abdominal pain, heartburn, nausea and vomiting.  Skin: Negative.  Negative for itching and rash.  Neurological:  Negative for dizziness and headaches.  Endo/Heme/Allergies:  Positive for environmental allergies. Does not bruise/bleed easily.      Objective:   Blood pressure 118/70, pulse 64, temperature 98.7 F (37.1 C), temperature source Temporal, resp. rate 16, height 5\' 4"  (1.626 m), weight 194 lb 9.6 oz (88.3 kg), SpO2 99 %. Body mass index is 33.4 kg/m.   Physical Exam:  Physical Exam Vitals reviewed.  Constitutional:      Appearance: She is well-developed.  HENT:     Head: Normocephalic and atraumatic.     Right Ear: Tympanic membrane,  ear canal and external ear normal.     Left Ear: Tympanic membrane, ear canal and external ear normal.     Nose: No nasal deformity, septal deviation, mucosal edema or rhinorrhea.     Right Turbinates: Enlarged and swollen.     Left Turbinates: Enlarged and swollen.     Right Sinus: No maxillary sinus tenderness or frontal sinus tenderness.     Left Sinus: No maxillary sinus tenderness or frontal sinus tenderness.     Mouth/Throat:     Lips: Pink.     Mouth: Mucous membranes are moist. Mucous membranes are not pale and not dry.     Pharynx: Uvula midline.  Eyes:     General: Lids are normal. Allergic shiner present.        Right eye: No discharge.        Left eye: No discharge.     Conjunctiva/sclera: Conjunctivae normal.     Right eye: Right conjunctiva is not injected. No chemosis.    Left eye: Left conjunctiva is not injected. No chemosis.  Pupils: Pupils are equal, round, and reactive to light.  Cardiovascular:     Rate and Rhythm: Normal rate and regular rhythm.     Heart sounds: Normal heart sounds.  Pulmonary:     Effort: Pulmonary effort is normal. No tachypnea, accessory muscle usage or respiratory distress.     Breath sounds: Normal breath sounds. No wheezing, rhonchi or rales.     Comments: Moving air well in all lung fields. No increased work of breathing noted.  Chest:     Chest wall: No tenderness.  Lymphadenopathy:     Cervical: No cervical adenopathy.  Skin:    General: Skin is warm.     Capillary Refill: Capillary refill takes less than 2 seconds.     Coloration: Skin is not pale.     Findings: No abrasion, erythema, petechiae or rash. Rash is not papular, urticarial or vesicular.     Comments: No eczematous or urticarial lesions noted.   Neurological:     Mental Status: She is alert.  Psychiatric:        Behavior: Behavior is cooperative.     Diagnostic studies:    Spirometry: results normal (FEV1: 2.33/81%, FVC: 2.90/89%, FEV1/FVC: 80%).     Spirometry consistent with normal pattern.   Allergy Studies: none        Malachi Bonds, MD  [facility]

## 2021-05-22 NOTE — Patient Instructions (Addendum)
2. Moderate persistent asthma, uncomplicated - Lung testing looks awesome.  - We will send in refills on all of your medications.  - Daily controller medication(s): Singulair 10mg  daily and Breo 200/90mcg one puff once daily - Prior to physical activity: ProAir 2 puffs 10-15 minutes before physical activity. - Rescue medications: ProAir 4 puffs every 4-6 hours as needed - Asthma control goals:  * Full participation in all desired activities (may need albuterol before activity) * Albuterol use two time or less a week on average (not counting use with activity) * Cough interfering with sleep two time or less a month * Oral steroids no more than once a year * No hospitalizations   3. Chronic rhinitis (trees, weeds, grasses, indoor molds, outdoor molds, dust mites, cat and cockroach) - We will plan to restart allergy shots next spring when you return from college.  - Continue with: Zyrtec (cetirizine) 10mg  tablet once daily, Singulair (montelukast) 10mg  daily  4. Adverse food reaction (peanuts, sesame) - Epinephrine is up to date. - Continue to avoid these foods.   4. GERD - Add on omeprazole 20 mg daily.  5. Return in about 3 months or earlier if needed.    Please inform 01-26-1992 of any Emergency Department visits, hospitalizations, or changes in symptoms. Call before going to the ED for breathing or allergy symptoms since we might be able to fit you in for a sick visit. Feel free to contact anytime with any questions, problems, or concerns.  It was a pleasure to see you again today!  Websites that have reliable patient information: 1. American Academy of Asthma, Allergy, and Immunology: www.aaaai.org 2. Food Allergy Research and Education (FARE): foodallergy.org 3. Mothers of Asthmatics: http://www.asthmacommunitynetwork.org 4. American College of Allergy, Asthma, and Immunology: www.acaai.org   COVID-19 Vaccine Information can be found at:  [place] For questions related to vaccine distribution or appointments, please email [email or web address] or call [phone].   We realize that you might be concerned about having an allergic reaction to the COVID19 vaccines. To help with that concern, WE ARE OFFERING THE COVID19 VACCINES IN OUR OFFICE! Ask the front desk for dates!     "Like" [place] on Facebook and Instagram for our latest updates!      A healthy democracy works best when [email or web address] participate! Make sure you are registered to vote! If you have moved or changed any of your contact information, you will need to get this updated before voting!  In some cases, you MAY be able to register to vote online: [phone]    [place]: [email or web address])

## 2021-05-23 ENCOUNTER — Encounter: Payer: Self-pay | Admitting: Allergy & Immunology

## 2021-07-12 ENCOUNTER — Ambulatory Visit: Payer: Medicaid Other | Admitting: Internal Medicine

## 2021-07-12 ENCOUNTER — Other Ambulatory Visit: Payer: Self-pay

## 2021-07-12 ENCOUNTER — Encounter: Payer: Self-pay | Admitting: Internal Medicine

## 2021-07-12 DIAGNOSIS — K219 Gastro-esophageal reflux disease without esophagitis: Secondary | ICD-10-CM

## 2021-07-12 DIAGNOSIS — R200 Anesthesia of skin: Secondary | ICD-10-CM

## 2021-07-12 DIAGNOSIS — R079 Chest pain, unspecified: Secondary | ICD-10-CM | POA: Diagnosis not present

## 2021-07-12 NOTE — Patient Instructions (Signed)
Please start taking Vitamin B12 1000 mcg once daily.  Please start taking Omeprazole twice daily for now. Avoid hot and spicy food.  Please get fasting blood tests done before the next visit.

## 2021-07-12 NOTE — Progress Notes (Signed)
Virtual Visit via Telephone Note   This visit type was conducted due to national recommendations for restrictions regarding the COVID-19 Pandemic (e.g. social distancing) in an effort to limit this patient's exposure and mitigate transmission in our community.  Due to her co-morbid illnesses, this patient is at least at moderate risk for complications without adequate follow up.  This format is felt to be most appropriate for this patient at this time.  The patient did not have access to video technology/had technical difficulties with video requiring transitioning to audio format only (telephone).  All issues noted in this document were discussed and addressed.  No physical exam could be performed with this format.  Evaluation Performed:  Follow-up visit  Date:  07/12/2021   ID:  Pamela Wells, DOB [birth year], MRN [id number]  Patient Location: Home Provider Location: Office/Clinic  Participants: Patient Location of Patient: Home Location of Provider: Telehealth Consent was obtain for visit to be over via telehealth. I verified that I am speaking with the correct person using two identifiers.  PCP:  Heather Roberts, NP   Chief Complaint: Chest pain/tightness  History of Present Illness:    Pamela Wells is a 21 y.o. female who has a televisit for complaint of chest pain/tightness, which is chronic and intermittent and is not related to her activity level.  She has had such episodes while resting as well.  She denies any dyspnea or palpitations.  She also reports numbness of the feet, which is intermittent and is also not related to her activity level.  She denies any spells of anxiety.  Denies any recent stress at home.  The patient does not have symptoms concerning for COVID-19 infection (fever, chills, cough, or new shortness of breath).   Past Medical, Surgical, Social History, Allergies, and Medications have been Reviewed.  Past Medical History:  Diagnosis Date    Abdominal pain, RUQ (right upper quadrant) 09/03/2017   ASCUS of cervix with negative high risk HPV 08/09/2020   Repeat in 1 year.   Asthma    no inhaler use in the past 2 months. 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   cetirizine (ZYRTEC) 10 MG tablet Take 1 tablet (10 mg total) by mouth daily.   fluticasone furoate-vilanterol (BREO ELLIPTA) 200-25 MCG/ACT AEPB Inhale 1 puff into the lungs daily.   ibuprofen (ADVIL) 800 MG tablet Take by mouth.   montelukast (SINGULAIR) 10 MG tablet Take 1 tablet (10 mg total) by mouth at bedtime.  omeprazole (PRILOSEC) 20 MG capsule Take 1 capsule (20 mg total) by mouth daily.     Allergies:   Lactose intolerance (gi), Peanut-containing drug products, Sesame oil, Mixed grasses, and Pollen extract   ROS:   Please see the history of present illness.     All other systems reviewed and are negative.   Labs/Other Tests and Data Reviewed:    Recent Labs: 03/18/2021: ALT 14; BUN 7; Creatinine, Ser 0.75; Hemoglobin 14.2; Platelets  255; Potassium 4.2; Sodium 139   Recent Lipid Panel Lab Results  Component Value Date/Time   CHOL 198 03/18/2021 09:57 AM   TRIG 107 03/18/2021 09:57 AM   HDL 55 03/18/2021 09:57 AM   LDLCALC 124 (H) 03/18/2021 09:57 AM    Wt Readings from Last 3 Encounters:  05/22/21 194 lb 9.6 oz (88.3 kg)  03/18/21 203 lb (92.1 kg)  10/22/20 209 lb (94.8 kg)     ASSESSMENT & PLAN:    Chest pain Unclear etiology currently as she has episodes at rest as well Chronic and intermittent, could be related to GERD On omeprazole 20 mg daily currently, advised to try twice daily dose for now Avoid hot and spicy food Advised to go to ER if she has chest pain with dyspnea or palpitations Will check EKG in the office in the next visit  Leg numbness Most common etiology would be micronutrient deficiency like vitamin B-12 Advised to start vitamin B-12 1000 mcg QD Check CBC, BMP, TSH with free T4 and Vitamin B12 level    Time:   Today, I have spent 15 minutes reviewing the chart, including problem list, medications, and with the patient with telehealth technology discussing the above problems.   Medication Adjustments/Labs and Tests Ordered: Current medicines are reviewed at length with the patient today.  Concerns regarding medicines are outlined above.   Tests Ordered: No orders of the defined types were placed in this encounter.   Medication Changes: No orders of the defined types were placed in this encounter.    Note: This dictation was prepared with Dragon dictation along with smaller phrase technology. Similar sounding words can be transcribed inadequately or may not be corrected upon review. Any transcriptional errors that result from this process are unintentional.      Disposition:  Follow up  Signed, Anabel Halon, MD  07/12/2021 11:37 AM     [facility]

## 2021-07-19 ENCOUNTER — Telehealth: Payer: Self-pay | Admitting: Nurse Practitioner

## 2021-07-19 NOTE — Telephone Encounter (Signed)
Pt stopped by --she states that her Father had Genetic testing for high cholestorol completed and he has that gene.   She wants to know if she can have the same testing down to see if she carries that same gene.  I advised the pt to get a copy of the bloodwork from when her dad had the genetic testing and send via mychart to [place]

## 2021-07-20 LAB — CBC WITH DIFFERENTIAL/PLATELET
Basophils Absolute: 0 10*3/uL (ref 0.0–0.2)
Basos: 0 %
EOS (ABSOLUTE): 0.2 10*3/uL (ref 0.0–0.4)
Eos: 3 %
Hematocrit: 41.9 % (ref 34.0–46.6)
Hemoglobin: 14 g/dL (ref 11.1–15.9)
Immature Grans (Abs): 0 10*3/uL (ref 0.0–0.1)
Immature Granulocytes: 0 %
Lymphocytes Absolute: 2.5 10*3/uL (ref 0.7–3.1)
Lymphs: 39 %
MCH: 28.9 pg (ref 26.6–33.0)
MCHC: 33.4 g/dL (ref 31.5–35.7)
MCV: 86 fL (ref 79–97)
Monocytes Absolute: 0.4 10*3/uL (ref 0.1–0.9)
Monocytes: 7 %
Neutrophils Absolute: 3.4 10*3/uL (ref 1.4–7.0)
Neutrophils: 51 %
Platelets: 251 10*3/uL (ref 150–450)
RBC: 4.85 x10E6/uL (ref 3.77–5.28)
RDW: 12.3 % (ref 11.7–15.4)
WBC: 6.5 10*3/uL (ref 3.4–10.8)

## 2021-07-20 LAB — BASIC METABOLIC PANEL
BUN/Creatinine Ratio: 23 (ref 9–23)
BUN: 14 mg/dL (ref 6–20)
CO2: 22 mmol/L (ref 20–29)
Calcium: 9.5 mg/dL (ref 8.7–10.2)
Chloride: 101 mmol/L (ref 96–106)
Creatinine, Ser: 0.6 mg/dL (ref 0.57–1.00)
Glucose: 83 mg/dL (ref 70–99)
Potassium: 4.3 mmol/L (ref 3.5–5.2)
Sodium: 139 mmol/L (ref 134–144)
eGFR: 130 mL/min/{1.73_m2} (ref >59)

## 2021-07-20 LAB — TSH+FREE T4
Free T4: 1.19 ng/dL (ref 0.82–1.77)
TSH: 1.03 u[IU]/mL (ref 0.450–4.500)

## 2021-07-20 LAB — VITAMIN B12: Vitamin B-12: 568 pg/mL (ref 232–1245)

## 2021-08-12 ENCOUNTER — Ambulatory Visit: Payer: Medicaid Other | Admitting: Internal Medicine

## 2021-08-12 ENCOUNTER — Other Ambulatory Visit: Payer: Self-pay

## 2021-08-12 ENCOUNTER — Encounter: Payer: Self-pay | Admitting: Internal Medicine

## 2021-08-12 VITALS — BP 114/72 | HR 71 | Resp 18 | Ht 64.0 in | Wt 206.0 lb

## 2021-08-12 DIAGNOSIS — K219 Gastro-esophageal reflux disease without esophagitis: Secondary | ICD-10-CM | POA: Diagnosis not present

## 2021-08-12 DIAGNOSIS — G4700 Insomnia, unspecified: Secondary | ICD-10-CM

## 2021-08-12 DIAGNOSIS — G43109 Migraine with aura, not intractable, without status migrainosus: Secondary | ICD-10-CM

## 2021-08-12 DIAGNOSIS — E782 Mixed hyperlipidemia: Secondary | ICD-10-CM | POA: Insufficient documentation

## 2021-08-12 DIAGNOSIS — Z118 Encounter for screening for other infectious and parasitic diseases: Secondary | ICD-10-CM

## 2021-08-12 DIAGNOSIS — N926 Irregular menstruation, unspecified: Secondary | ICD-10-CM

## 2021-08-12 MED ORDER — RIZATRIPTAN BENZOATE 10 MG PO TABS: 10.0000 mg | ORAL_TABLET | ORAL | 0 refills | Status: AC | PRN

## 2021-08-12 MED ORDER — PROPRANOLOL HCL 20 MG PO TABS: 20.0000 mg | ORAL_TABLET | Freq: Two times a day (BID) | ORAL | 2 refills | Status: AC

## 2021-08-12 NOTE — Assessment & Plan Note (Addendum)
Lipid profile reviewed, showed elevated LDL, but not out of ordinary for an additional investigation for familial hypercholesterolemia for now Advised to follow low cholesterol diet for now

## 2021-08-12 NOTE — Assessment & Plan Note (Signed)
Refer to [facility] Started propranolol for migraine PPx Maxalt for breakthrough headache Recent stress at school could be provoking her headache as well

## 2021-08-12 NOTE — Progress Notes (Signed)
122

## 2021-08-12 NOTE — Assessment & Plan Note (Signed)
Needs to stay compliant with omeprazole Likely reason for epigastric pain/chest pain

## 2021-08-12 NOTE — Patient Instructions (Signed)
Please take Propranolol for migraine. Please take Rizatriptan as needed for headache as discussed.  Please continue to take other medications as prescribed.

## 2021-08-12 NOTE — Assessment & Plan Note (Signed)
Complains of irregular menstrual cycles, refer to OB/GYN

## 2021-08-12 NOTE — Progress Notes (Signed)
Established Patient Office Visit  Subjective:  Patient ID: Pamela Wells, female    DOB: 19-Sep-2000  Age: 21 y.o. MRN: [id number]  CC:  Chief Complaint  Patient presents with   Follow-up    1 month follow up chest pain and leg numbness this has only happened one time this month also pt wants genetic testing ordered for lipoprotein A as her father has this and needs new referral to neuro for migraines see gna however they no longer take her insurance     HPI Pamela Wells is a 21 y.o. female with past medical history of GERD, asthma, migraine and HLD who presents for f/u of her chronic medical conditions.  She complains of epigastric and substernal chest pain, and had only 1 episode since her last visit.  Of note, she has run out of her omeprazole for GERD.  She also reports being stressed due to her schoolwork recently.  Her sleep is impaired and wakes up multiple times in the middle of the night.  She denies any anhedonia, SI or HI currently.  She also complains of severe headache episodes, and used to see [facility] for it, but needs a new referral as they do not accept her insurance now.  She has tried Topamax, Elavil and Maxalt in the past, but has not had much relief.  She asks about lipoprotein a test as her father has h/o abnormal lipoprotein a level.   Past Medical History:  Diagnosis Date   Abdominal pain, RUQ (right upper quadrant) 09/03/2017   ASCUS of cervix with negative high risk HPV 08/09/2020   Repeat in 1 year.   Asthma    no inhaler use in the past 2 months. 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   Urticaria     Past Surgical History:  Procedure Laterality Date   ESOPHAGOGASTRODUODENOSCOPY N/A 09/25/2017   Procedure: ESOPHAGOGASTRODUODENOSCOPY (EGD);  Surgeon: Danie Binder, MD;  [facility];  Service: Endoscopy;  Laterality: N/A;  9:30am   TONGUE SURGERY     HAD FRENULUM CLIPPED    Family History  Problem Relation Age of Onset   Hypertension Father    Diabetes Father    Ovarian cancer Maternal Grandmother    Stroke Maternal Grandfather    Colon cancer Neg Hx    Colon polyps Neg Hx     Social History   Socioeconomic History   Marital status: Single    Spouse name: Not on file   Number of children: 1   Years of education: Not on file   Highest education level: Some college, no degree  Occupational History    Comment: Optometrist, [place]  Tobacco Use   Smoking status: Never   Smokeless tobacco: Never  Vaping Use   Vaping Use: Never used  Substance and Sexual Activity   Alcohol use: Yes    Comment: rarely   Drug use: No   Sexual activity: Yes    Birth control/protection: Condom, Pill  Other Topics Concern  Not on file  Social History Narrative   In college, [place]    Eats all food groups.   Wears seatbelt.    Drives a car.    Does not smoke.    Reports that is religious and believes in God.   Reports she is not sexually active.  Denies any tobacco, alcohol, or drug use.   Social Determinants of Health   Financial Resource Strain: Not on file  Food Insecurity: Not on file  Transportation Needs: Not on file  Physical Activity: Not on file  Stress: Not on file  Social Connections: Not on file  Intimate Partner Violence: Not on file    Outpatient Medications Prior to Visit  Medication Sig Dispense Refill   albuterol (VENTOLIN HFA) 108 (90 Base) MCG/ACT inhaler Inhale 1-2 puffs into the lungs every 6 (six) hours as needed for wheezing or shortness  of breath. 18 g 2   cetirizine (ZYRTEC) 10 MG tablet Take 1 tablet (10 mg total) by mouth daily. 90 tablet 3   fluticasone furoate-vilanterol (BREO ELLIPTA) 200-25 MCG/ACT AEPB Inhale 1 puff into the lungs daily. 28 each 5   ibuprofen (ADVIL) 800 MG tablet Take by mouth.     montelukast (SINGULAIR) 10 MG tablet Take 1 tablet (10 mg total) by mouth at bedtime. 90 tablet 3   omeprazole (PRILOSEC) 20 MG capsule Take 1 capsule (20 mg total) by mouth daily. 90 capsule 3   No facility-administered medications prior to visit.    Allergies  Allergen Reactions   Lactose Intolerance (Gi)    Peanut-Containing Drug Products Hives   Sesame Oil    Mixed Grasses    Pollen Extract     ROS Review of Systems  Constitutional:  Negative for chills and fever.  HENT:  Negative for congestion, sinus pressure and sinus pain.   Respiratory:  Negative for cough and shortness of breath.   Cardiovascular:  Positive for chest pain. Negative for palpitations.  Gastrointestinal:  Negative for constipation, diarrhea and vomiting.  Genitourinary:  Negative for dysuria and hematuria.  Musculoskeletal:  Negative for neck pain and neck stiffness.  Skin:  Negative for rash.  Neurological:  Positive for headaches. Negative for dizziness and weakness.  Psychiatric/Behavioral:  Positive for sleep disturbance. Negative for agitation and behavioral problems.      Objective:    Physical Exam Vitals reviewed.  Constitutional:      General: She is not in acute distress.    Appearance: She is obese. She is not diaphoretic.  HENT:     Head: Normocephalic and atraumatic.     Nose: Nose normal. No congestion.     Mouth/Throat:     Mouth: Mucous membranes are moist.     Pharynx: No posterior oropharyngeal erythema.  Eyes:     General: No scleral icterus.    Extraocular Movements: Extraocular movements intact.  Cardiovascular:     Rate and Rhythm: Normal rate and regular rhythm.     Pulses: Normal pulses.     Heart  sounds: Normal heart sounds. No murmur heard. Pulmonary:     Breath sounds: Normal breath sounds. No wheezing or rales.  Musculoskeletal:     Cervical back: Neck supple. No tenderness.     Right lower leg: No edema.     Left lower leg: No edema.  Skin:    General: Skin is warm.     Findings: No rash.  Neurological:     General: No focal deficit present.  Mental Status: She is alert and oriented to person, place, and time.     Sensory: No sensory deficit.     Motor: No weakness.  Psychiatric:        Mood and Affect: Mood normal.        Behavior: Behavior normal.    BP 114/72 (BP Location: Right Arm, Patient Position: Sitting, Cuff Size: Normal)    Pulse 71    Resp 18    Ht _0  (1.626 m)    Wt 206 lb 0.6 oz (93.5 kg)    SpO2 99%    BMI 35.37 kg/m  Wt Readings from Last 3 Encounters:  08/12/21 206 lb 0.6 oz (93.5 kg)  05/22/21 194 lb 9.6 oz (88.3 kg)  03/18/21 203 lb (92.1 kg)    Lab Results  Component Value Date   TSH 1.030 07/19/2021   Lab Results  Component Value Date   WBC 6.5 07/19/2021   HGB 14.0 07/19/2021   HCT 41.9 07/19/2021   MCV 86 07/19/2021   PLT 251 07/19/2021   Lab Results  Component Value Date   NA 139 07/19/2021   K 4.3 07/19/2021   CO2 22 07/19/2021   GLUCOSE 83 07/19/2021   BUN 14 07/19/2021   CREATININE 0.60 07/19/2021   BILITOT 0.4 03/18/2021   ALKPHOS 82 03/18/2021   AST 21 03/18/2021   ALT 14 03/18/2021   PROT 7.6 03/18/2021   ALBUMIN 4.6 03/18/2021   CALCIUM 9.5 07/19/2021   ANIONGAP 11 01/10/2019   EGFR 130 07/19/2021   Lab Results  Component Value Date   CHOL 198 03/18/2021   Lab Results  Component Value Date   HDL 55 03/18/2021   Lab Results  Component Value Date   LDLCALC 124 (H) 03/18/2021   Lab Results  Component Value Date   TRIG 107 03/18/2021   No results found for: CHOLHDL Lab Results  Component Value Date   HGBA1C 5.3 02/03/2019      Assessment & Plan:   Problem List Items Addressed This Visit        Cardiovascular and Mediastinum   Migraine with aura and without status migrainosus, not intractable - Primary    Refer to [facility] Started propranolol for migraine PPx Maxalt for breakthrough headache Recent stress at school could be provoking her headache as well      Relevant Medications   propranolol (INDERAL) 20 MG tablet   rizatriptan (MAXALT) 10 MG tablet   Other Relevant Orders   Ambulatory referral to Neurology     Digestive   GERD (gastroesophageal reflux disease)    Needs to stay compliant with omeprazole Likely reason for epigastric pain/chest pain        Other   Irregular periods    Complains of irregular menstrual cycles, refer to OB/GYN      Relevant Orders   Ambulatory referral to Obstetrics / Gynecology   Insomnia    Likely due to recent stress with schoolwork Sleep hygiene discussed Relaxation techniques for anxiety Melatonin PRN      Mixed hyperlipidemia    Lipid profile reviewed, showed elevated LDL, but not out of ordinary for an additional investigation for familial hypercholesterolemia for now Advised to follow low cholesterol diet for now      Relevant Medications   propranolol (INDERAL) 20 MG tablet   Other Visit Diagnoses     Screening for chlamydial disease       Relevant Orders   Chlamydia/GC NAA, Confirmation  Meds ordered this encounter  Medications   propranolol (INDERAL) 20 MG tablet    Sig: Take 1 tablet (20 mg total) by mouth 2 (two) times daily.    Dispense:  60 tablet    Refill:  2   rizatriptan (MAXALT) 10 MG tablet    Sig: Take 1 tablet (10 mg total) by mouth as needed for migraine. May repeat in 2 hours if needed    Dispense:  10 tablet    Refill:  0    Follow-up: Return in about 4 months (around 12/10/2021) for Migraine.    Lindell Spar, MD

## 2021-08-12 NOTE — Assessment & Plan Note (Signed)
Likely due to recent stress with schoolwork Sleep hygiene discussed Relaxation techniques for anxiety Melatonin PRN

## 2021-08-13 ENCOUNTER — Encounter: Payer: Self-pay | Admitting: Neurology

## 2021-08-14 LAB — CHLAMYDIA/GC NAA, CONFIRMATION
Chlamydia trachomatis, NAA: NEGATIVE
Neisseria gonorrhoeae, NAA: NEGATIVE

## 2021-08-23 ENCOUNTER — Ambulatory Visit (INDEPENDENT_AMBULATORY_CARE_PROVIDER_SITE_OTHER): Payer: Medicaid Other | Admitting: Allergy & Immunology

## 2021-08-23 ENCOUNTER — Encounter: Payer: Self-pay | Admitting: Allergy & Immunology

## 2021-08-23 ENCOUNTER — Other Ambulatory Visit: Payer: Self-pay

## 2021-08-23 ENCOUNTER — Ambulatory Visit: Payer: Medicaid Other | Admitting: Allergy & Immunology

## 2021-08-23 VITALS — Ht 64.0 in | Wt 199.0 lb

## 2021-08-23 DIAGNOSIS — R490 Dysphonia: Secondary | ICD-10-CM | POA: Diagnosis not present

## 2021-08-23 DIAGNOSIS — J3089 Other allergic rhinitis: Secondary | ICD-10-CM | POA: Diagnosis not present

## 2021-08-23 DIAGNOSIS — J4540 Moderate persistent asthma, uncomplicated: Secondary | ICD-10-CM

## 2021-08-23 DIAGNOSIS — J302 Other seasonal allergic rhinitis: Secondary | ICD-10-CM

## 2021-08-23 MED ORDER — FLUTICASONE FUROATE-VILANTEROL 200-25 MCG/ACT IN AEPB: 1.0000 | INHALATION_SPRAY | Freq: Every day | RESPIRATORY_TRACT | 5 refills | Status: AC

## 2021-08-23 MED ORDER — MONTELUKAST SODIUM 10 MG PO TABS: 10.0000 mg | ORAL_TABLET | Freq: Every day | ORAL | 3 refills | Status: AC

## 2021-08-23 MED ORDER — CETIRIZINE HCL 10 MG PO TABS: 10.0000 mg | ORAL_TABLET | Freq: Every day | ORAL | 3 refills | Status: AC

## 2021-08-23 NOTE — Progress Notes (Signed)
RE: Pamela Wells MRN: [id number] DOB: [birth year] Date of Telemedicine Visit: 08/23/2021  Referring provider: Noreene Larsson, NP Primary care provider: Lindell Spar, MD  Chief Complaint: Asthma (Says her asthma is well. No issues or concerns about her asthma today. ) and Allergic Rhinitis  (Says she has minor pressures around her eyes and nose. Says she went to get OTC meds and is feeling better.)   Telemedicine Follow Up Visit via Telephone: I connected with Pamela Wells for a follow up on 08/23/21 by telephone and verified that I am speaking with the correct person using two identifiers.   I discussed the limitations, risks, security and privacy concerns of performing an evaluation and management service by telephone and the availability of in person appointments. I also discussed with the patient that there may be a patient responsible charge related to this service. The patient expressed understanding and agreed to proceed.  Patient is at home.  Provider is at the office.  Visit start time: 9:37 AM Visit end time: 10:00 AM Insurance consent/check in by: Angela Nevin Medical consent and medical assistant/nurse: Cree  History of Present Illness:  She is a 21 y.o. female, who is being followed for allergies and asthma . Her previous allergy office visit was in November 2022 with myself. She was last seen in November 2022. At that time, her lung testing looked awesome.  We continued with Singulair 10 mg daily and Breo 1 puff once daily.  For her allergic rhinitis, we plan to restart her allergy shots next spring when she was home from college.  We continue with Zyrtec as well as Singulair.  She continues to avoid peanuts and sesame.  For her reflux, we added on omeprazole 20 mg daily.  Since last visit, she has mostly done well. She is having some trouble sleeping at night. She woke up at 3am and could not fall back asleep until 6am. Even when she does sleep well, she is having a  sensation that she did not sleep throughout the night. She has never had a sleep study but she is open to that. She is going to see Dr. Tomi Likens about migraines and she will discuss a sleep study at that time.   Asthma/Respiratory Symptom History: She reports that her asthma is under good control. She has noticed that she has some raspiness in the morning when she wakes up. She reports that being hot at night makes her breathing worse. She remains on her Breo. She has not needed prednisone since we saw her. She has had one ED visit in September 2022 for evaluation of hoarseness. They could not figure out whether this was sinus related or asthma related. She had ten episodes of losing her voice. She was treated with steroids and antibiotics. She did not keep on doing this because it would not help or alleviate it completely. The steroids would bring her voice back but the antibiotics never did anything at all.   Allergic Rhinitis Symptom History: She has been noted to have swollen glands and lymph nodes. This continued to happen and these were painful intermittently. Other times it would not be painful. She describes that it sounded like she was in bad pain or crying to other people. She has been referred to Dr. Benjamine Mola in September, but she tells me that no one ever called back. She is interested in restarting her allergy shots. She is still in her college, but she wants to drive back and forth for the allergy  shots. She actually finishes in May 2023 with a degree in Programmer, multimedia and Psychology. 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    GERD Symptom History: She has been on omeprazole without improvement in her symptoms.  She would prefer to stop this.  Otherwise, there have been no changes to her past medical history, surgical history, family history, or social history.  Assessment and Plan:  Allison is a 21 y.o. female with:  Moderate persistent asthma, uncomplicated      Seasonal and perennial allergic rhinitis (trees, weeds, grasses, indoor molds, outdoor molds, dust mites, cat and cockroach) - wishing to restart allergen immunotherapy in the spring 2023   Adverse food reaction (peanut, sesame)   Lactose intolerance     We will refer her to ENT for evaluation of hoarseness.  It seems that she has been referred to ENT in the past, she claims that she got no phone calls back.  Regardless, we will give this another try.  For her breathing, we will continue with the [facility].  She is interested in starting allergy shots.  She made appointment to do that.  She has some mild prescriptions from June 2019 that we can use.  Diagnostics: None.  Medication List:  Current Outpatient Medications  Medication Sig Dispense Refill   albuterol (VENTOLIN HFA) 108 (90 Base) MCG/ACT inhaler Inhale 1-2 puffs into the lungs every 6 (six) hours as needed for wheezing or shortness of breath. 18 g 2   cetirizine (ZYRTEC) 10 MG tablet Take 1 tablet (10 mg total) by mouth daily. 90 tablet 3   fluticasone furoate-vilanterol (BREO ELLIPTA) 200-25 MCG/ACT AEPB Inhale 1 puff into the lungs daily. 28 each 5   montelukast (SINGULAIR) 10 MG tablet Take 1 tablet (10 mg total) by mouth at bedtime. 90 tablet 3   omeprazole (PRILOSEC) 20 MG capsule Take 1 capsule (20 mg total) by mouth daily. 90 capsule 3   propranolol (INDERAL) 20 MG tablet Take 1 tablet (20 mg total) by mouth 2 (two) times daily. 60 tablet 2   rizatriptan (MAXALT) 10 MG tablet Take 1 tablet (10 mg total) by mouth as needed for migraine. May repeat in 2 hours if needed 10 tablet 0   No current facility-administered medications for this visit.   Allergies: Allergies  Allergen Reactions   Lactose Intolerance (Gi)    Peanut-Containing Drug Products Hives   Sesame Oil    Mixed Grasses    Pollen Extract    I reviewed her past medical history, social history, family history, and environmental history and no significant changes  have been reported from previous visits.  Review of Systems  Constitutional:  Negative for activity change, appetite change, chills, diaphoresis and fatigue.  HENT:  Negative for congestion, postnasal drip, rhinorrhea, sinus pressure and sore throat.   Eyes:  Negative for pain, discharge, redness and itching.  Respiratory:  Negative for shortness of breath, wheezing and stridor.   Gastrointestinal:  Negative for diarrhea, nausea and vomiting.  Endocrine: Negative for cold intolerance and heat intolerance.  Musculoskeletal:  Negative for arthralgias, joint swelling and myalgias.  Skin:  Negative for rash.  Allergic/Immunologic: Negative for environmental allergies and food allergies.   Objective:  Physical exam not obtained as encounter was done via telephone.   Previous notes and tests were reviewed.  I discussed the assessment and treatment plan with the patient. The patient was provided an opportunity to ask questions and all were answered. The patient agreed  with the plan and demonstrated an understanding of the instructions.   The patient was advised to call back or seek an in-person evaluation if the symptoms worsen or if the condition fails to improve as anticipated.  I provided 23 minutes of non-face-to-face time during this encounter.  It was my pleasure to participate in Pamela Wells's care today. Please feel free to contact me with any questions or concerns.   Sincerely,  Valentina Shaggy, MD

## 2021-08-28 DIAGNOSIS — J3081 Allergic rhinitis due to animal (cat) (dog) hair and dander: Secondary | ICD-10-CM | POA: Diagnosis not present

## 2021-08-28 NOTE — Progress Notes (Signed)
VIALS EXP 08-28-22 °

## 2021-08-29 DIAGNOSIS — J3089 Other allergic rhinitis: Secondary | ICD-10-CM | POA: Diagnosis not present

## 2021-09-03 ENCOUNTER — Telehealth: Payer: Self-pay

## 2021-09-03 NOTE — Telephone Encounter (Signed)
-----   Message from Alfonse Spruce, MD sent at 08/23/2021  2:29 PM EST ----- ENT referral placed.

## 2021-09-04 ENCOUNTER — Ambulatory Visit: Payer: Medicaid Other

## 2021-09-19 ENCOUNTER — Ambulatory Visit: Payer: Medicaid Other

## 2021-11-04 NOTE — Progress Notes (Deleted)
NEUROLOGY CONSULTATION NOTE  Pamela Wells MRN: [id number] DOB: May 17, 2001  Referring provider: Trena Platt, MD Primary care provider: Trena Platt, MD  Reason for consult:  migraines  Assessment/Plan:   ***   Subjective:  Pamela Wells is a 21 year old female with asthma who presents for migraines.  History supplemented by prior neurologist's and referring provider's notes.  Onset:  *** Location:  *** Quality:  *** Intensity:  ***.  *** denies new headache, thunderclap headache or severe headache that wakes *** from sleep. Aura:  *** Prodrome:  *** Postdrome:  *** Associated symptoms:  ***.  *** denies associated unilateral numbness or weakness. Duration:  *** Frequency:  *** Frequency of abortive medication: *** Triggers:  *** Relieving factors:  *** Activity:  ***  MRI of brain with and without contrast on 2/***/2020 personally reviewed was normal.   Current NSAIDS/analgesics:  *** Current triptans:  rizatriptan 10mg  Current ergotamine:  none Current anti-emetic:  none Current muscle relaxants:  none Current Antihypertensive medications:  propranolol 20mg  BID Current Antidepressant medications:  none Current Anticonvulsant medications:  none Current anti-CGRP:  none Current Vitamins/Herbal/Supplements:  none Current Antihistamines/Decongestants:  Zyrtec Other therapy:  *** Hormone/birth control:  none  Past NSAIDS/analgesics:  Fioricet Past abortive triptans:  sumatriptan tab Past abortive ergotamine:  none Past muscle relaxants:  none Past anti-emetic:  Zofran Past antihypertensive medications:  *** Past antidepressant medications:  amitriptyline, imipramine Past anticonvulsant medications:  topiramate Past anti-CGRP:  none Past vitamins/Herbal/Supplements:  none Past antihistamines/decongestants:  Sudafed Other past therapies:  ***  Caffeine:  *** Alcohol:  *** Smoker:  *** Diet:  *** Exercise:  *** Depression:  ***; Anxiety:   *** Other pain:  *** Sleep hygiene:  *** Family history of headache:  ***      PAST MEDICAL HISTORY: Past Medical History:  Diagnosis Date   Abdominal pain, RUQ (right upper quadrant) 09/03/2017   ASCUS of cervix with negative high risk HPV 08/09/2020   Repeat in 1 year.   Asthma    no inhaler use in the past 2 months. 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 18 g 2   cetirizine (ZYRTEC) 10 MG tablet Take 1 tablet (10 mg total) by mouth daily. 90 tablet 3   fluticasone furoate-vilanterol (BREO ELLIPTA) 200-25 MCG/ACT AEPB Inhale 1 puff into the lungs daily. 28 each 5   montelukast (SINGULAIR) 10 MG tablet Take 1 tablet (10 mg  total) by mouth at bedtime. 90 tablet 3   propranolol (INDERAL) 20 MG tablet Take 1 tablet (20 mg total) by mouth 2 (two) times daily. 60 tablet 2   rizatriptan (MAXALT) 10 MG tablet Take 1 tablet (10 mg total) by mouth as needed for migraine. May  repeat in 2 hours if needed 10 tablet 0   No current facility-administered medications on file prior to visit.    ALLERGIES: Allergies  Allergen Reactions   Lactose Intolerance (Gi)    Peanut-Containing Drug Products Hives   Sesame Oil    Mixed Grasses    Pollen Extract     FAMILY HISTORY: Family History  Problem Relation Age of Onset   Hypertension Father    Diabetes Father    Ovarian cancer Maternal Grandmother    Stroke Maternal Grandfather    Colon cancer Neg Hx    Colon polyps Neg Hx     Objective:  *** General: No acute distress.  Patient appears well-groomed.   Head:  Normocephalic/atraumatic Eyes:  fundi examined but not visualized Neck: supple, no paraspinal tenderness, full range of motion Back: No paraspinal tenderness Heart: regular rate and rhythm Lungs: Clear to auscultation bilaterally. Vascular: No carotid bruits. Neurological Exam: Mental status: alert and oriented to person, place, and time, recent and remote memory intact, fund of knowledge intact, attention and concentration intact, speech fluent and not dysarthric, language intact. Cranial nerves: CN I: not tested CN II: pupils equal, round and reactive to light, visual fields intact CN III, IV, VI:  full range of motion, no nystagmus, no ptosis CN V: facial sensation intact. CN VII: upper and lower face symmetric CN VIII: hearing intact CN IX, X: gag intact, uvula midline CN XI: sternocleidomastoid and trapezius muscles intact CN XII: tongue midline Bulk & Tone: normal, no fasciculations. Motor:  muscle strength 5/5 throughout Sensation:  Pinprick, temperature and vibratory sensation intact. Deep Tendon Reflexes:  2+ throughout,  toes downgoing.   Finger to nose testing:  Without dysmetria.   Heel to shin:  Without dysmetria.   Gait:  Normal station and stride.  Romberg negative.    Thank you for allowing me to take part in the care of this patient.  Shon Millet, DO  CC: ***

## 2021-11-05 ENCOUNTER — Encounter: Payer: Self-pay | Admitting: Neurology

## 2021-11-05 ENCOUNTER — Ambulatory Visit: Payer: Medicaid Other | Admitting: Neurology

## 2021-11-05 DIAGNOSIS — Z029 Encounter for administrative examinations, unspecified: Secondary | ICD-10-CM

## 2021-12-10 ENCOUNTER — Ambulatory Visit: Payer: Medicaid Other | Admitting: Internal Medicine

## 2021-12-20 ENCOUNTER — Ambulatory Visit: Payer: Medicaid Other | Admitting: Allergy & Immunology

## 2022-03-19 ENCOUNTER — Encounter: Payer: Medicaid Other | Admitting: Internal Medicine

## 2022-09-15 ENCOUNTER — Telehealth: Payer: Self-pay

## 2022-09-15 NOTE — Telephone Encounter (Signed)
Called and left a message for patient to call our office back to see if she wanted to continue allergy injections as he vials have expired as of the end of February. If patient wishes to continue allergy injections she would be charged a new set and would need to be advised of the three restart policy. If patient wishes to terminate immunotherapy she'd need to sign the discontinuation form. Patient would also need to schedule a follow up appointment before restarting.
